# Patient Record
Sex: Female | Born: 1945 | Race: White | Hispanic: No | Marital: Married | Smoking: Former smoker
Health system: Southern US, Community
[De-identification: ages and names within clinical notes are randomized; demographics above are authoritative.]

## PROBLEM LIST (undated history)

## (undated) DIAGNOSIS — R112 Nausea with vomiting, unspecified: Secondary | ICD-10-CM

## (undated) DIAGNOSIS — Z9889 Other specified postprocedural states: Secondary | ICD-10-CM

## (undated) DIAGNOSIS — I10 Essential (primary) hypertension: Secondary | ICD-10-CM

## (undated) DIAGNOSIS — E7800 Pure hypercholesterolemia, unspecified: Secondary | ICD-10-CM

## (undated) DIAGNOSIS — D219 Benign neoplasm of connective and other soft tissue, unspecified: Secondary | ICD-10-CM

## (undated) HISTORY — PX: ABDOMINAL HYSTERECTOMY: SHX81

## (undated) HISTORY — DX: Essential (primary) hypertension: I10

## (undated) HISTORY — DX: Pure hypercholesterolemia, unspecified: E78.00

## (undated) HISTORY — DX: Benign neoplasm of connective and other soft tissue, unspecified: D21.9

---

## 2003-11-04 ENCOUNTER — Ambulatory Visit (HOSPITAL_COMMUNITY): Admission: RE | Admit: 2003-11-04 | Discharge: 2003-11-04 | Payer: Self-pay | Admitting: General Surgery

## 2004-02-24 ENCOUNTER — Ambulatory Visit (HOSPITAL_COMMUNITY): Admission: RE | Admit: 2004-02-24 | Discharge: 2004-02-24 | Payer: Self-pay | Admitting: Internal Medicine

## 2014-03-04 ENCOUNTER — Telehealth: Payer: Self-pay | Admitting: *Deleted

## 2014-03-04 NOTE — Telephone Encounter (Signed)
I called pt. According to out records, her last colonoscopy was 5/23/20015 by Dr. Laural Golden and the next was due in 10 years. She had received out letter from the recall list.  She said that she did have a colonoscopy in [place] in 2012 and it was normal and she was told her next one would be 10 years from that one. She did not know who did it.

## 2014-03-04 NOTE — Telephone Encounter (Signed)
Pt called stating she had a TCS in 2012 in [place]. Pt got a letter in the mail to schedule her colonoscopy. Please advise [phone] ask for 224 pt is at work.

## 2014-03-05 NOTE — Telephone Encounter (Signed)
Please check date of last TCS with Dr. Laural Golden, ?02/2004.  Can we request records of 2012 TCS from [facility]?

## 2014-03-06 NOTE — Telephone Encounter (Signed)
Called and informed pt. Sending to Manuela Schwartz to nic the next on in 03/2021.

## 2014-03-06 NOTE — Telephone Encounter (Signed)
Agree. Patient is not due for TCS until 03/2021 unless she has FH of colon cancer or colon polyps or she is having problems.   Please NIC for TCS 03/2021.

## 2014-03-06 NOTE — Telephone Encounter (Signed)
Pt's colonoscopy with Dr. Laural Golden was 02/24/2004. The one she had at [facility] was 03/25/2011 and the recommendation was for the next in 10 years.   Rita Marsh, these copies are on your cart!

## 2014-03-07 NOTE — Telephone Encounter (Signed)
I put pt on the recall list for next tcs 03/2021 and flagged it as a NUR patient. Is that correct?

## 2015-10-29 DIAGNOSIS — N189 Chronic kidney disease, unspecified: Secondary | ICD-10-CM | POA: Diagnosis not present

## 2015-10-29 DIAGNOSIS — Z0000 Encounter for general adult medical examination without abnormal findings: Secondary | ICD-10-CM | POA: Diagnosis not present

## 2015-12-22 DIAGNOSIS — Z1231 Encounter for screening mammogram for malignant neoplasm of breast: Secondary | ICD-10-CM | POA: Diagnosis not present

## 2016-01-29 DIAGNOSIS — M1711 Unilateral primary osteoarthritis, right knee: Secondary | ICD-10-CM | POA: Diagnosis not present

## 2016-01-29 DIAGNOSIS — M25461 Effusion, right knee: Secondary | ICD-10-CM | POA: Diagnosis not present

## 2016-02-02 DIAGNOSIS — L816 Other disorders of diminished melanin formation: Secondary | ICD-10-CM | POA: Diagnosis not present

## 2016-02-02 DIAGNOSIS — I872 Venous insufficiency (chronic) (peripheral): Secondary | ICD-10-CM | POA: Diagnosis not present

## 2016-02-05 DIAGNOSIS — H2513 Age-related nuclear cataract, bilateral: Secondary | ICD-10-CM | POA: Diagnosis not present

## 2016-02-05 DIAGNOSIS — H04123 Dry eye syndrome of bilateral lacrimal glands: Secondary | ICD-10-CM | POA: Diagnosis not present

## 2016-03-24 DIAGNOSIS — I10 Essential (primary) hypertension: Secondary | ICD-10-CM | POA: Diagnosis not present

## 2016-03-24 DIAGNOSIS — M1991 Primary osteoarthritis, unspecified site: Secondary | ICD-10-CM | POA: Diagnosis not present

## 2016-03-24 DIAGNOSIS — E782 Mixed hyperlipidemia: Secondary | ICD-10-CM | POA: Diagnosis not present

## 2016-03-24 DIAGNOSIS — K219 Gastro-esophageal reflux disease without esophagitis: Secondary | ICD-10-CM | POA: Diagnosis not present

## 2016-03-31 DIAGNOSIS — Z0001 Encounter for general adult medical examination with abnormal findings: Secondary | ICD-10-CM | POA: Diagnosis not present

## 2016-03-31 DIAGNOSIS — Z1389 Encounter for screening for other disorder: Secondary | ICD-10-CM | POA: Diagnosis not present

## 2016-03-31 DIAGNOSIS — N393 Stress incontinence (female) (male): Secondary | ICD-10-CM | POA: Diagnosis not present

## 2016-03-31 DIAGNOSIS — E782 Mixed hyperlipidemia: Secondary | ICD-10-CM | POA: Diagnosis not present

## 2016-03-31 DIAGNOSIS — I10 Essential (primary) hypertension: Secondary | ICD-10-CM | POA: Diagnosis not present

## 2016-03-31 DIAGNOSIS — M1711 Unilateral primary osteoarthritis, right knee: Secondary | ICD-10-CM | POA: Diagnosis not present

## 2016-03-31 DIAGNOSIS — K219 Gastro-esophageal reflux disease without esophagitis: Secondary | ICD-10-CM | POA: Diagnosis not present

## 2016-04-28 DIAGNOSIS — Z79899 Other long term (current) drug therapy: Secondary | ICD-10-CM | POA: Diagnosis not present

## 2016-04-28 DIAGNOSIS — Z87891 Personal history of nicotine dependence: Secondary | ICD-10-CM | POA: Diagnosis not present

## 2016-04-28 DIAGNOSIS — K219 Gastro-esophageal reflux disease without esophagitis: Secondary | ICD-10-CM | POA: Diagnosis not present

## 2016-04-28 DIAGNOSIS — Z90710 Acquired absence of both cervix and uterus: Secondary | ICD-10-CM | POA: Diagnosis not present

## 2016-04-28 DIAGNOSIS — Z13820 Encounter for screening for osteoporosis: Secondary | ICD-10-CM | POA: Diagnosis not present

## 2016-04-28 DIAGNOSIS — M8580 Other specified disorders of bone density and structure, unspecified site: Secondary | ICD-10-CM | POA: Diagnosis not present

## 2016-04-28 DIAGNOSIS — E7800 Pure hypercholesterolemia, unspecified: Secondary | ICD-10-CM | POA: Diagnosis not present

## 2016-04-28 DIAGNOSIS — M1990 Unspecified osteoarthritis, unspecified site: Secondary | ICD-10-CM | POA: Diagnosis not present

## 2016-04-28 DIAGNOSIS — Z780 Asymptomatic menopausal state: Secondary | ICD-10-CM | POA: Diagnosis not present

## 2016-07-06 DIAGNOSIS — H5319 Other subjective visual disturbances: Secondary | ICD-10-CM | POA: Diagnosis not present

## 2016-07-06 DIAGNOSIS — H04123 Dry eye syndrome of bilateral lacrimal glands: Secondary | ICD-10-CM | POA: Diagnosis not present

## 2016-09-02 DIAGNOSIS — L820 Inflamed seborrheic keratosis: Secondary | ICD-10-CM | POA: Diagnosis not present

## 2016-11-15 DIAGNOSIS — I10 Essential (primary) hypertension: Secondary | ICD-10-CM | POA: Diagnosis not present

## 2016-11-15 DIAGNOSIS — K219 Gastro-esophageal reflux disease without esophagitis: Secondary | ICD-10-CM | POA: Diagnosis not present

## 2016-11-15 DIAGNOSIS — E782 Mixed hyperlipidemia: Secondary | ICD-10-CM | POA: Diagnosis not present

## 2016-11-15 DIAGNOSIS — M1990 Unspecified osteoarthritis, unspecified site: Secondary | ICD-10-CM | POA: Diagnosis not present

## 2016-11-18 DIAGNOSIS — M7551 Bursitis of right shoulder: Secondary | ICD-10-CM | POA: Diagnosis not present

## 2016-11-18 DIAGNOSIS — I10 Essential (primary) hypertension: Secondary | ICD-10-CM | POA: Diagnosis not present

## 2016-11-18 DIAGNOSIS — M1711 Unilateral primary osteoarthritis, right knee: Secondary | ICD-10-CM | POA: Diagnosis not present

## 2016-11-18 DIAGNOSIS — K219 Gastro-esophageal reflux disease without esophagitis: Secondary | ICD-10-CM | POA: Diagnosis not present

## 2016-11-18 DIAGNOSIS — E782 Mixed hyperlipidemia: Secondary | ICD-10-CM | POA: Diagnosis not present

## 2016-11-18 DIAGNOSIS — Z6836 Body mass index (BMI) 36.0-36.9, adult: Secondary | ICD-10-CM | POA: Diagnosis not present

## 2016-12-23 DIAGNOSIS — Z1231 Encounter for screening mammogram for malignant neoplasm of breast: Secondary | ICD-10-CM | POA: Diagnosis not present

## 2017-01-18 ENCOUNTER — Ambulatory Visit: Payer: Self-pay | Admitting: Family Medicine

## 2017-06-22 DIAGNOSIS — K219 Gastro-esophageal reflux disease without esophagitis: Secondary | ICD-10-CM | POA: Diagnosis not present

## 2017-06-22 DIAGNOSIS — E782 Mixed hyperlipidemia: Secondary | ICD-10-CM | POA: Diagnosis not present

## 2017-06-22 DIAGNOSIS — I10 Essential (primary) hypertension: Secondary | ICD-10-CM | POA: Diagnosis not present

## 2017-06-27 DIAGNOSIS — E039 Hypothyroidism, unspecified: Secondary | ICD-10-CM | POA: Diagnosis not present

## 2017-06-27 DIAGNOSIS — I10 Essential (primary) hypertension: Secondary | ICD-10-CM | POA: Diagnosis not present

## 2017-06-27 DIAGNOSIS — M1711 Unilateral primary osteoarthritis, right knee: Secondary | ICD-10-CM | POA: Diagnosis not present

## 2017-06-27 DIAGNOSIS — K219 Gastro-esophageal reflux disease without esophagitis: Secondary | ICD-10-CM | POA: Diagnosis not present

## 2017-06-27 DIAGNOSIS — E782 Mixed hyperlipidemia: Secondary | ICD-10-CM | POA: Diagnosis not present

## 2017-06-27 DIAGNOSIS — Z6836 Body mass index (BMI) 36.0-36.9, adult: Secondary | ICD-10-CM | POA: Diagnosis not present

## 2017-06-27 DIAGNOSIS — N393 Stress incontinence (female) (male): Secondary | ICD-10-CM | POA: Diagnosis not present

## 2017-06-27 DIAGNOSIS — Z0001 Encounter for general adult medical examination with abnormal findings: Secondary | ICD-10-CM | POA: Diagnosis not present

## 2017-10-17 ENCOUNTER — Ambulatory Visit: Payer: PPO | Admitting: Adult Health

## 2017-10-17 ENCOUNTER — Encounter: Payer: Self-pay | Admitting: Adult Health

## 2017-10-17 ENCOUNTER — Encounter (INDEPENDENT_AMBULATORY_CARE_PROVIDER_SITE_OTHER): Payer: Self-pay

## 2017-10-17 VITALS — BP 130/70 | HR 69 | Ht 64.0 in | Wt 227.5 lb

## 2017-10-17 DIAGNOSIS — R300 Dysuria: Secondary | ICD-10-CM | POA: Diagnosis not present

## 2017-10-17 DIAGNOSIS — N812 Incomplete uterovaginal prolapse: Secondary | ICD-10-CM

## 2017-10-17 LAB — POCT URINALYSIS DIPSTICK
Blood, UA: NEGATIVE
GLUCOSE UA: NEGATIVE
KETONES UA: NEGATIVE
Leukocytes, UA: NEGATIVE
Nitrite, UA: NEGATIVE
Protein, UA: NEGATIVE

## 2017-10-17 NOTE — Progress Notes (Signed)
Subjective:     Patient ID: Rita Marsh, female   DOB: [birth year], 72 y.o.   MRN: [id number]  HPI Shenia is a 72 year old black female, married in complaining of "feels like bladder dropped" and burning with urination and hot flashes. She has ring pessary for 4 years but it is not working for last 6+months.She is sp hysterectomy for fibroids. 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 PCP is Dr Pleas Koch in [place].  Review of Systems +burning with urination Feels like bladder dropped +hot flashes  Reviewed past medical,surgical, social and family history. Reviewed medications and allergies.     Objective:   Physical Exam BP 130/70 (BP Location: Left Arm, Patient Position: Sitting, Cuff Size: Large)   Pulse 69   Ht 5\' 4"  (1.626 m)   Wt 227 lb 8 oz (103.2 kg)   BMI 39.05 kg/m Urine dipstick negative. PHQ 2 score 0. Skin warm and dry.Pelvic: external genitalia is normal in appearance no lesions, vagina: pale with loss of moisture and rugae, tan discharge, no odor, with +cycstocele, old #3 ring pessary removed,urethra has no lesions or masses noted, cervix and uterus absent, adnexa: no masses or tenderness noted. Bladder is non tender and no masses felt.Dr Elonda Husky in and pt fitted with #6 ring pessary with support. Discussed SSRI for hot flashes and she declines for now.     Assessment:     1. Cystocele with incomplete uterovaginal prolapse   2. Burning with urination       Plan:    Order Milex #6 ring with support  Return in 2 weeks for pessary insertion

## 2017-10-31 ENCOUNTER — Ambulatory Visit: Payer: PPO | Admitting: Adult Health

## 2017-10-31 ENCOUNTER — Other Ambulatory Visit: Payer: Self-pay

## 2017-10-31 ENCOUNTER — Encounter: Payer: Self-pay | Admitting: Adult Health

## 2017-10-31 VITALS — BP 130/72 | HR 85 | Ht 64.0 in | Wt 225.0 lb

## 2017-10-31 DIAGNOSIS — N812 Incomplete uterovaginal prolapse: Secondary | ICD-10-CM | POA: Diagnosis not present

## 2017-10-31 NOTE — Progress Notes (Signed)
Subjective:     Patient ID: Rita Marsh, female   DOB: 06-24-1946, 72 y.o.   MRN: [id number]  HPI  Rita Marsh is a 72 year old black female in for pessary insertion, she is coming from exercise this morning she says.  Review of Systems For pessary insertion Reviewed past medical,surgical, social and family history. Reviewed medications and allergies.     Objective:   Physical Exam BP 130/72 (BP Location: Right Arm, Patient Position: Sitting, Cuff Size: Large)   Pulse 85   Ht 5\' 4"  (1.626 m)   Wt 225 lb (102.1 kg)   BMI 38.62 kg/m Skin warm and dry, pt has cystocele protruding vagina, # 6 ring with support easily inserted and pt,walked around in room, no discomfort.      Assessment:     1. Cystocele with incomplete uterovaginal prolapse   pessary inserted      Plan:    F/U in 2 weeks on pessary

## 2017-11-14 ENCOUNTER — Ambulatory Visit: Payer: PPO | Admitting: Adult Health

## 2017-11-17 ENCOUNTER — Encounter: Payer: Self-pay | Admitting: Adult Health

## 2017-11-17 ENCOUNTER — Ambulatory Visit: Payer: PPO | Admitting: Adult Health

## 2017-11-17 VITALS — BP 124/60 | HR 68 | Ht 64.0 in | Wt 219.0 lb

## 2017-11-17 DIAGNOSIS — N812 Incomplete uterovaginal prolapse: Secondary | ICD-10-CM

## 2017-11-17 DIAGNOSIS — Z4689 Encounter for fitting and adjustment of other specified devices: Secondary | ICD-10-CM | POA: Diagnosis not present

## 2017-11-17 NOTE — Progress Notes (Signed)
Subjective:     Patient ID: Rita Marsh, female   DOB: June 16, 1946, 72 y.o.   MRN: [id number]  HPI Rita Marsh is a 72 year old black female in for recheck on pessary, and it is good, but had discharge with some pink first few days after having it inserted, better now.   Review of Systems +discahrge when first got new pessary, better now Reviewed past medical,surgical, social and family history. Reviewed medications and allergies.     Objective:   Physical Exam BP 124/60 (BP Location: Left Arm, Patient Position: Sitting, Cuff Size: Normal)   Pulse 68   Ht 5\' 4"  (1.626 m)   Wt 219 lb (99.3 kg)   BMI 37.59 kg/m    Pessary removed and cleaned with warm water and soap, she has some tan discharge, with no odor and or irritation on speculum exam noted, +cystocele and prolapse,Pessay easily reinserted.She will try to remove and clean herself, but if she I can't I will.   Assessment:     1. Cystocele with incomplete uterovaginal prolapse   2. Pessary maintenance       Plan:     F/U in 3 months of needs pessary cleaned and reinserted

## 2017-12-26 DIAGNOSIS — Z1231 Encounter for screening mammogram for malignant neoplasm of breast: Secondary | ICD-10-CM | POA: Diagnosis not present

## 2018-02-14 ENCOUNTER — Ambulatory Visit: Payer: PPO | Admitting: Adult Health

## 2018-05-08 DIAGNOSIS — L820 Inflamed seborrheic keratosis: Secondary | ICD-10-CM | POA: Diagnosis not present

## 2018-07-21 DIAGNOSIS — I10 Essential (primary) hypertension: Secondary | ICD-10-CM | POA: Diagnosis not present

## 2018-07-21 DIAGNOSIS — E782 Mixed hyperlipidemia: Secondary | ICD-10-CM | POA: Diagnosis not present

## 2018-07-21 DIAGNOSIS — H2513 Age-related nuclear cataract, bilateral: Secondary | ICD-10-CM | POA: Diagnosis not present

## 2018-07-21 DIAGNOSIS — H40033 Anatomical narrow angle, bilateral: Secondary | ICD-10-CM | POA: Diagnosis not present

## 2018-07-21 DIAGNOSIS — E039 Hypothyroidism, unspecified: Secondary | ICD-10-CM | POA: Diagnosis not present

## 2018-07-21 DIAGNOSIS — K219 Gastro-esophageal reflux disease without esophagitis: Secondary | ICD-10-CM | POA: Diagnosis not present

## 2018-07-26 DIAGNOSIS — Z6836 Body mass index (BMI) 36.0-36.9, adult: Secondary | ICD-10-CM | POA: Diagnosis not present

## 2018-07-26 DIAGNOSIS — K219 Gastro-esophageal reflux disease without esophagitis: Secondary | ICD-10-CM | POA: Diagnosis not present

## 2018-07-26 DIAGNOSIS — M1711 Unilateral primary osteoarthritis, right knee: Secondary | ICD-10-CM | POA: Diagnosis not present

## 2018-07-26 DIAGNOSIS — Z23 Encounter for immunization: Secondary | ICD-10-CM | POA: Diagnosis not present

## 2018-07-26 DIAGNOSIS — Z0001 Encounter for general adult medical examination with abnormal findings: Secondary | ICD-10-CM | POA: Diagnosis not present

## 2018-07-26 DIAGNOSIS — E782 Mixed hyperlipidemia: Secondary | ICD-10-CM | POA: Diagnosis not present

## 2018-07-26 DIAGNOSIS — I10 Essential (primary) hypertension: Secondary | ICD-10-CM | POA: Diagnosis not present

## 2018-07-26 DIAGNOSIS — E039 Hypothyroidism, unspecified: Secondary | ICD-10-CM | POA: Diagnosis not present

## 2018-08-25 DIAGNOSIS — H2511 Age-related nuclear cataract, right eye: Secondary | ICD-10-CM | POA: Diagnosis not present

## 2018-08-25 DIAGNOSIS — H25812 Combined forms of age-related cataract, left eye: Secondary | ICD-10-CM | POA: Diagnosis not present

## 2018-08-25 DIAGNOSIS — H25811 Combined forms of age-related cataract, right eye: Secondary | ICD-10-CM | POA: Diagnosis not present

## 2018-09-01 DIAGNOSIS — H04123 Dry eye syndrome of bilateral lacrimal glands: Secondary | ICD-10-CM | POA: Diagnosis not present

## 2018-11-03 DIAGNOSIS — H2512 Age-related nuclear cataract, left eye: Secondary | ICD-10-CM | POA: Diagnosis not present

## 2018-11-03 DIAGNOSIS — H25812 Combined forms of age-related cataract, left eye: Secondary | ICD-10-CM | POA: Diagnosis not present

## 2018-11-10 DIAGNOSIS — H04123 Dry eye syndrome of bilateral lacrimal glands: Secondary | ICD-10-CM | POA: Diagnosis not present

## 2019-01-29 DIAGNOSIS — R300 Dysuria: Secondary | ICD-10-CM | POA: Diagnosis not present

## 2019-01-29 DIAGNOSIS — Z6835 Body mass index (BMI) 35.0-35.9, adult: Secondary | ICD-10-CM | POA: Diagnosis not present

## 2019-02-12 ENCOUNTER — Telehealth: Payer: Self-pay | Admitting: Obstetrics & Gynecology

## 2019-02-12 NOTE — Telephone Encounter (Signed)
Patient called stating that the last time she came in they placed a pessary and she would like to schedule and appointment to check her pessary. Please contact pt

## 2019-02-15 ENCOUNTER — Telehealth: Payer: Self-pay | Admitting: *Deleted

## 2019-02-15 NOTE — Telephone Encounter (Signed)
Patient informed that we are not allowing visitors or children to come to appointments at this time. Patient advised to wear a mask to her appointment if she has one. Patient denies any contact with anyone suspected or confirmed of having COVID-19. Pt denies fever, cough, sob, muscle pain, diarrhea, rash, vomiting, abdominal pain, red eye, weakness, bruising or bleeding, joint pain or severe headache.    

## 2019-02-16 ENCOUNTER — Encounter: Payer: Self-pay | Admitting: Adult Health

## 2019-02-16 ENCOUNTER — Other Ambulatory Visit: Payer: Self-pay

## 2019-02-16 ENCOUNTER — Ambulatory Visit: Payer: PPO | Admitting: Adult Health

## 2019-02-16 VITALS — BP 136/75 | HR 80 | Temp 98.1°F | Ht 64.0 in | Wt 210.5 lb

## 2019-02-16 DIAGNOSIS — Z4689 Encounter for fitting and adjustment of other specified devices: Secondary | ICD-10-CM | POA: Diagnosis not present

## 2019-02-16 DIAGNOSIS — N812 Incomplete uterovaginal prolapse: Secondary | ICD-10-CM | POA: Diagnosis not present

## 2019-02-16 NOTE — Progress Notes (Signed)
Patient ID: Rita Marsh, female   DOB: Feb 22, 1946, 73 y.o.   MRN: [id number] History of Present Illness: Rita Marsh is a 73 year old black female, married, sp hysterectomy with cystocele, that was first seen in January 2019 and fitted with # 6 pessary by Dr Elonda Husky and it was inserted 11/01/27. She has noticed lately pain in lower stomach and felt it was related to her pessary so she took it out and put a # 3 ring she had in and pain went away but it is not enough support.  PCP is Dr Pleas Koch in [place].   Current Medications, Allergies, Past Medical History, Past Surgical History, Family History and Social History were reviewed in [facility] record.     Review of Systems: Stomach hurt with #6 pessary and had some blood when removed She is wearing old #3 ring but needs bigger pessary for support, she is active, exercised 3 X per week     Physical Exam:BP 136/75 (BP Location: Right Arm, Patient Position: Sitting, Cuff Size: Large)   Pulse 80   Temp 98.1 F (36.7 C)   Ht 5\' 4"  (1.626 m)   Wt 210 lb 8 oz (95.5 kg)   BMI 36.13 kg/m  General:  Well developed, well nourished, no acute distress Skin:  Warm and dry Pelvic:  External genitalia is normal in appearance, no lesions.  The vagina is pink with cystocele, #3 ring removed.No lesions seen, but did have some blood,felt to be from removing and reinserting the pessaries. Urethra has no lesions or masses. The cervix and uterus are absent.  No adnexal masses or tenderness noted.Bladder is non tender, no masses felt. Tried #4 ring with support and it felt good to her, and then removed it and tried #5 and she says it felt a little big, she wants the #4, so will order #4 ring with support. I washed her old # 3 pessary and reinserted it for her.  Psych:  No mood changes, alert and cooperative,seems happy Examination chaperoned by Levy Pupa LPN.    Impression and Plan:  1. Encounter for fitting and adjustment of  pessary -will order #4 ring pessary with support -Will call her when it comes in for placement   2. Cystocele with incomplete uterovaginal prolapse

## 2019-02-20 DIAGNOSIS — Z1231 Encounter for screening mammogram for malignant neoplasm of breast: Secondary | ICD-10-CM | POA: Diagnosis not present

## 2019-03-02 ENCOUNTER — Telehealth: Payer: Self-pay | Admitting: *Deleted

## 2019-03-02 NOTE — Telephone Encounter (Signed)
Patient informed we are still not allowing any visitors or children to come in during appointment time unless physical assistance is needed. Asked if has had any exposure to anyone suspected or confirmed of having COVID-19 or if she was experiencing any of the following, to reschedule: fever, cough, shortness of breath, muscle pain, diarrhea, rash, vomiting, abdominal pain, red eye, weakness, bruising, bleeding, joint pain, or a severe headache.  Stated no to all.  Asked that she complete E-check-in via mychart prior to arrival.  Advised to check-in via [facility] and call our office on arrival in our office parking lot to complete registration over the phone. Advised to also use the provided hand sanitizer when entering the office and to wear a mask if they have one of their own, if not, we are happy to provide one. Pt verbalized understanding.      

## 2019-03-05 ENCOUNTER — Other Ambulatory Visit: Payer: Self-pay

## 2019-03-05 ENCOUNTER — Ambulatory Visit (INDEPENDENT_AMBULATORY_CARE_PROVIDER_SITE_OTHER): Payer: PPO | Admitting: Adult Health

## 2019-03-05 ENCOUNTER — Encounter: Payer: Self-pay | Admitting: Adult Health

## 2019-03-05 VITALS — BP 130/62 | HR 64 | Ht 64.0 in | Wt 213.5 lb

## 2019-03-05 DIAGNOSIS — N812 Incomplete uterovaginal prolapse: Secondary | ICD-10-CM | POA: Diagnosis not present

## 2019-03-05 DIAGNOSIS — Z4689 Encounter for fitting and adjustment of other specified devices: Secondary | ICD-10-CM | POA: Diagnosis not present

## 2019-03-05 NOTE — Progress Notes (Addendum)
  Subjective:     Patient ID: Rita Marsh, female   DOB: 1946/06/21, 73 y.o.   MRN: [id number]  HPI Rita Marsh is a 73 year old black female, sp hysterectomy with cystocele, in for insertion of new #4 ring with support pessary. PCP is Dr Pleas Koch.  Review of Systems Has had pressure with #6 pessary so she removed and using # 3 ring but was having some prolapse.  Reviewed past medical,surgical, social and family history. Reviewed medications and allergies.     Objective:   Physical Exam BP 130/62 (BP Location: Left Arm, Patient Position: Sitting, Cuff Size: Normal)   Pulse 64   Ht 5\' 4"  (1.626 m)   Wt 213 lb 8 oz (96.8 kg)   BMI 36.65 kg/m   Skin warm and dry.Pelvic: external genitalia is normal in appearance no lesions, vagina: +cystocele, old pessary removed and cleaned, no irritation noted, has creamy discharge, no odor, then #4 ring with support inserted and ahe says it feel good, used mirror to let her see, urethra has no lesions or masses noted, cervix and uterus absent. She removes and cleans pessary herself. Old pessary given back to her in baggie.  Examination chaperoned by Estill Bamberg Rash LPN.    Assessment:     1. Pessary maintenance   2. Cystocele with incomplete uterovaginal prolapse       Plan:     # 4 ring pessary with support inserted Follow up prn

## 2019-03-05 NOTE — Patient Instructions (Signed)
Follow up prn

## 2019-03-06 DIAGNOSIS — H43393 Other vitreous opacities, bilateral: Secondary | ICD-10-CM | POA: Diagnosis not present

## 2019-03-08 DIAGNOSIS — H43812 Vitreous degeneration, left eye: Secondary | ICD-10-CM | POA: Diagnosis not present

## 2019-04-02 DIAGNOSIS — H43812 Vitreous degeneration, left eye: Secondary | ICD-10-CM | POA: Diagnosis not present

## 2019-07-30 DIAGNOSIS — E782 Mixed hyperlipidemia: Secondary | ICD-10-CM | POA: Diagnosis not present

## 2019-07-30 DIAGNOSIS — E039 Hypothyroidism, unspecified: Secondary | ICD-10-CM | POA: Diagnosis not present

## 2019-07-30 DIAGNOSIS — I10 Essential (primary) hypertension: Secondary | ICD-10-CM | POA: Diagnosis not present

## 2019-07-30 DIAGNOSIS — K219 Gastro-esophageal reflux disease without esophagitis: Secondary | ICD-10-CM | POA: Diagnosis not present

## 2019-08-08 DIAGNOSIS — Z23 Encounter for immunization: Secondary | ICD-10-CM | POA: Diagnosis not present

## 2019-08-08 DIAGNOSIS — E782 Mixed hyperlipidemia: Secondary | ICD-10-CM | POA: Diagnosis not present

## 2019-08-08 DIAGNOSIS — K219 Gastro-esophageal reflux disease without esophagitis: Secondary | ICD-10-CM | POA: Diagnosis not present

## 2019-08-08 DIAGNOSIS — M7551 Bursitis of right shoulder: Secondary | ICD-10-CM | POA: Diagnosis not present

## 2019-08-08 DIAGNOSIS — E039 Hypothyroidism, unspecified: Secondary | ICD-10-CM | POA: Diagnosis not present

## 2019-08-08 DIAGNOSIS — I10 Essential (primary) hypertension: Secondary | ICD-10-CM | POA: Diagnosis not present

## 2019-08-08 DIAGNOSIS — Z6836 Body mass index (BMI) 36.0-36.9, adult: Secondary | ICD-10-CM | POA: Diagnosis not present

## 2019-08-08 DIAGNOSIS — Z0001 Encounter for general adult medical examination with abnormal findings: Secondary | ICD-10-CM | POA: Diagnosis not present

## 2019-11-27 DIAGNOSIS — E782 Mixed hyperlipidemia: Secondary | ICD-10-CM | POA: Diagnosis not present

## 2019-11-27 DIAGNOSIS — R946 Abnormal results of thyroid function studies: Secondary | ICD-10-CM | POA: Diagnosis not present

## 2019-12-13 DIAGNOSIS — I872 Venous insufficiency (chronic) (peripheral): Secondary | ICD-10-CM | POA: Diagnosis not present

## 2019-12-13 DIAGNOSIS — D2372 Other benign neoplasm of skin of left lower limb, including hip: Secondary | ICD-10-CM | POA: Diagnosis not present

## 2019-12-26 ENCOUNTER — Encounter (HOSPITAL_COMMUNITY): Payer: Self-pay | Admitting: Emergency Medicine

## 2019-12-26 ENCOUNTER — Other Ambulatory Visit: Payer: Self-pay

## 2019-12-26 ENCOUNTER — Observation Stay (HOSPITAL_COMMUNITY)
Admission: EM | Admit: 2019-12-26 | Discharge: 2019-12-27 | Disposition: A | Discharge status: Home / Self Care | Payer: PPO | Attending: Family Medicine | Admitting: Family Medicine

## 2019-12-26 ENCOUNTER — Emergency Department (HOSPITAL_COMMUNITY): Payer: PPO

## 2019-12-26 DIAGNOSIS — G459 Transient cerebral ischemic attack, unspecified: Secondary | ICD-10-CM | POA: Diagnosis not present

## 2019-12-26 DIAGNOSIS — I10 Essential (primary) hypertension: Secondary | ICD-10-CM

## 2019-12-26 DIAGNOSIS — Z79899 Other long term (current) drug therapy: Secondary | ICD-10-CM | POA: Diagnosis not present

## 2019-12-26 DIAGNOSIS — Z20822 Contact with and (suspected) exposure to covid-19: Secondary | ICD-10-CM | POA: Diagnosis not present

## 2019-12-26 DIAGNOSIS — E785 Hyperlipidemia, unspecified: Secondary | ICD-10-CM | POA: Diagnosis not present

## 2019-12-26 NOTE — ED Provider Notes (Signed)
[facility] EMERGENCY DEPARTMENT Provider Note   CSN: [id number] Arrival date & time: 12/26/19  2123   History Chief Complaint  Patient presents with  . Hypertension    Rita Marsh is a 74 y.o. female.  The history is provided by the patient.  Hypertension  She has history of hypertension and hyperlipidemia and comes in because of blood pressure being elevated at home.  She noted that the right side of her face got numb and she was worried that it was related to her blood pressure.  She checked her blood pressure and it was 189/94.  She checked the 2 other times and it was in the same range.  She denies headache or dizziness.  She denies any visual change.  She denies any weakness or incoordination.  She has been ambulatory without difficulty.  There was no numbness in her arms or legs.  Numbness subsided after about 1 hour and she feels like she is back to her baseline.  She does not usually check her blood pressure at home.  She is currently not taking any aspirin or any other antiplatelet agents.  Past Medical History:  Diagnosis Date  . Fibroids   . High cholesterol   . Hypertension     Patient Active Problem List   Diagnosis Date Noted  . Pessary maintenance 03/05/2019  . Cystocele with incomplete uterovaginal prolapse 03/05/2019    Past Surgical History:  Procedure Laterality Date  . ABDOMINAL HYSTERECTOMY       OB History    Gravida  3   Para  2   Term  2   Preterm      AB  1   Living  2     SAB  1   TAB      Ectopic      Multiple      Live Births  2           Family History  Problem Relation Age of Onset  . Other Paternal Grandmother        hit by car  . Heart attack Father   . High Cholesterol Mother   . Hypertension Mother   . Hypertension Brother   . High Cholesterol Brother     Social History   Tobacco Use  . Smoking status: Former Smoker    Years: 9.00    Types: Cigarettes  . Smokeless tobacco: Never Used    Substance Use Topics  . Alcohol use: No  . Drug use: No    Home Medications Prior to Admission medications   Medication Sig Start Date End Date Taking? Authorizing Provider  CALCIUM PO Take by mouth daily.    [provider]  fluticasone (FLONASE) 50 MCG/ACT nasal spray Place 2 sprays into both nostrils as needed.  08/29/17   [provider]  Loratadine (CLARITIN PO) Take by mouth as needed.    [provider]  losartan (COZAAR) 25 MG tablet Take 25 mg by mouth daily.  09/26/17   [provider]  Multiple Vitamin (MULTIVITAMIN) tablet Take 1 tablet by mouth daily.    [provider]  Omega-3 Fatty Acids (FISH OIL PO) Take by mouth daily.    [provider]  rosuvastatin (CRESTOR) 20 MG tablet Take 10 mg by mouth daily.     [provider]    Allergies    Patient has no known allergies.  Review of Systems   Review of Systems  All other systems reviewed  and are negative.   Physical Exam Updated Vital Signs BP (!) 152/79   Pulse (!) 106   Temp 98.5 F (36.9 C) (Oral)   Resp 16   Ht 5\' 5"  (1.651 m)   Wt 98.9 kg   SpO2 100%   BMI 36.28 kg/m   Physical Exam Vitals and nursing note reviewed.   74 year old female, resting comfortably and in no acute distress. Vital signs are significant for mildly elevated blood pressure and heart rate. Oxygen saturation is 100%, which is normal. Head is normocephalic and atraumatic. PERRLA, EOMI. Oropharynx is clear. Neck is nontender and supple without adenopathy or JVD.  There are no carotid bruits. Back is nontender and there is no CVA tenderness. Lungs are clear without rales, wheezes, or rhonchi. Chest is nontender. Heart has regular rate and rhythm without murmur. Abdomen is soft, flat, nontender without masses or hepatosplenomegaly and peristalsis is normoactive. Extremities have no cyanosis or edema, full range of motion is present. Skin is warm and dry without  rash. Neurologic: Mental status is normal, cranial nerves are intact, there are no motor or sensory deficits.  There is no pronator drift.  There is no extinction on double simultaneous stimulation.  ED Results / Procedures / Treatments   Labs (all labs ordered are listed, but only abnormal results are displayed) Labs Reviewed  COMPREHENSIVE METABOLIC PANEL - Abnormal; Notable for the following components:      Result Value   Glucose, Bld 106 (*)    Total Protein 8.5 (*)    All other components within normal limits  URINALYSIS, ROUTINE W REFLEX MICROSCOPIC - Abnormal; Notable for the following components:   Color, Urine STRAW (*)    Hgb urine dipstick SMALL (*)    Leukocytes,Ua SMALL (*)    All other components within normal limits  SARS CORONAVIRUS 2 (TAT 6-24 HRS)  ETHANOL  PROTIME-INR  APTT  CBC  DIFFERENTIAL  RAPID URINE DRUG SCREEN, HOSP PERFORMED    EKG EKG Interpretation  Date/Time:  Thursday December 27 2019 00:03:53 EDT Ventricular Rate:  86 PR Interval:    QRS Duration: 80 QT Interval:  380 QTC Calculation: 455 R Axis:   43 Text Interpretation: Sinus rhythm Probable left atrial enlargement Otherwise within normal limits No old tracing to compare Confirmed by Delora Fuel ([id number]) on 12/27/2019 12:08:33 AM   Radiology CT HEAD WO CONTRAST  Result Date: 12/27/2019 CLINICAL DATA:  Hypertension, headache, TIA EXAM: CT HEAD WITHOUT CONTRAST TECHNIQUE: Contiguous axial images were obtained from the base of the skull through the vertex without intravenous contrast. COMPARISON:  None. FINDINGS: Brain: No acute intracranial abnormality. Specifically, no hemorrhage, hydrocephalus, mass lesion, acute infarction, or significant intracranial injury. Vascular: No hyperdense vessel or unexpected calcification. Skull: No acute calvarial abnormality. Sinuses/Orbits: Visualized paranasal sinuses and mastoids clear. Orbital soft tissues unremarkable. Other: None IMPRESSION: No acute  intracranial abnormality. Electronically Signed   By: Rolm Baptise M.D.   On: 12/27/2019 00:47    Procedures Procedures   Medications Ordered in ED Medications - No data to display  ED Course  I have reviewed the triage vital signs and the nursing notes.  Pertinent labs & imaging results that were available during my care of the patient were reviewed by me and considered in my medical decision making (see chart for details).  MDM Rules/Calculators/A&P Transient facial numbness.  Elevated blood pressure.  Transient numbness is most consistent with TIA.  No neurologic deficits currently.  Will check screening labs and  CT of head.  Will need to be admitted for evaluation of TIA.  Old records are reviewed, and she has no relevant past visits.  ECG is unremarkable.  CT of head is unremarkable, labs are unremarkable.  Case is discussed with Dr. Scherrie November of [facility] hospital[facility], who agrees to admit the patient.  Final Clinical Impression(s) / ED Diagnoses Final diagnoses:  TIA (transient ischemic attack)  Elevated blood pressure reading with diagnosis of hypertension    Rx / DC Orders ED Discharge Orders    None       Delora Fuel, MD [id number]

## 2019-12-26 NOTE — ED Triage Notes (Signed)
Patient complains of hypertension that began this afternoon. Patient is on Losartan 25mg  and last took it this morning.

## 2019-12-27 ENCOUNTER — Observation Stay (HOSPITAL_COMMUNITY): Payer: PPO

## 2019-12-27 ENCOUNTER — Encounter (HOSPITAL_COMMUNITY): Payer: Self-pay | Admitting: Internal Medicine

## 2019-12-27 ENCOUNTER — Observation Stay (HOSPITAL_BASED_OUTPATIENT_CLINIC_OR_DEPARTMENT_OTHER): Payer: PPO

## 2019-12-27 DIAGNOSIS — R200 Anesthesia of skin: Secondary | ICD-10-CM | POA: Diagnosis not present

## 2019-12-27 DIAGNOSIS — E785 Hyperlipidemia, unspecified: Secondary | ICD-10-CM

## 2019-12-27 DIAGNOSIS — G459 Transient cerebral ischemic attack, unspecified: Secondary | ICD-10-CM

## 2019-12-27 DIAGNOSIS — I6523 Occlusion and stenosis of bilateral carotid arteries: Secondary | ICD-10-CM | POA: Diagnosis not present

## 2019-12-27 DIAGNOSIS — I10 Essential (primary) hypertension: Secondary | ICD-10-CM

## 2019-12-27 LAB — DIFFERENTIAL
Abs Immature Granulocytes: 0 10*3/uL (ref 0.00–0.07)
Basophils Absolute: 0 10*3/uL (ref 0.0–0.1)
Basophils Relative: 0 %
Eosinophils Absolute: 0.1 10*3/uL (ref 0.0–0.5)
Eosinophils Relative: 1 %
Immature Granulocytes: 0 %
Lymphocytes Relative: 31 %
Lymphs Abs: 2.1 10*3/uL (ref 0.7–4.0)
Monocytes Absolute: 0.6 10*3/uL (ref 0.1–1.0)
Monocytes Relative: 9 %
Neutro Abs: 4 10*3/uL (ref 1.7–7.7)
Neutrophils Relative %: 59 %

## 2019-12-27 LAB — VITAMIN B12: Vitamin B-12: 522 pg/mL (ref 180–914)

## 2019-12-27 LAB — COMPREHENSIVE METABOLIC PANEL
ALT: 17 U/L (ref 0–44)
AST: 25 U/L (ref 15–41)
Albumin: 4.4 g/dL (ref 3.5–5.0)
Alkaline Phosphatase: 59 U/L (ref 38–126)
Anion gap: 8 (ref 5–15)
BUN: 21 mg/dL (ref 8–23)
CO2: 26 mmol/L (ref 22–32)
Calcium: 9.2 mg/dL (ref 8.9–10.3)
Chloride: 103 mmol/L (ref 98–111)
Creatinine, Ser: 0.95 mg/dL (ref 0.44–1.00)
GFR calc Af Amer: >60 mL/min (ref >60)
GFR calc non Af Amer: >60 mL/min (ref >60)
Glucose, Bld: 106 mg/dL — ABNORMAL HIGH (ref 70–99)
Potassium: 3.9 mmol/L (ref 3.5–5.1)
Sodium: 137 mmol/L (ref 135–145)
Total Bilirubin: 0.6 mg/dL (ref 0.3–1.2)
Total Protein: 8.5 g/dL — ABNORMAL HIGH (ref 6.5–8.1)

## 2019-12-27 LAB — CBC
HCT: 41.6 % (ref 36.0–46.0)
Hemoglobin: 13.3 g/dL (ref 12.0–15.0)
MCH: 29.4 pg (ref 26.0–34.0)
MCHC: 32 g/dL (ref 30.0–36.0)
MCV: 91.8 fL (ref 80.0–100.0)
Platelets: 300 10*3/uL (ref 150–400)
RBC: 4.53 MIL/uL (ref 3.87–5.11)
RDW: 12.7 % (ref 11.5–15.5)
WBC: 6.8 10*3/uL (ref 4.0–10.5)
nRBC: 0 % (ref 0.0–0.2)

## 2019-12-27 LAB — URINALYSIS, ROUTINE W REFLEX MICROSCOPIC
Bacteria, UA: NONE SEEN
Bilirubin Urine: NEGATIVE
Glucose, UA: NEGATIVE mg/dL
Ketones, ur: NEGATIVE mg/dL
Nitrite: NEGATIVE
Protein, ur: NEGATIVE mg/dL
Specific Gravity, Urine: 1.006 (ref 1.005–1.030)
pH: 7 (ref 5.0–8.0)

## 2019-12-27 LAB — LIPID PANEL
Cholesterol: 157 mg/dL (ref 0–200)
HDL: 55 mg/dL (ref >40)
LDL Cholesterol: 88 mg/dL (ref 0–99)
Total CHOL/HDL Ratio: 2.9 RATIO
Triglycerides: 69 mg/dL (ref <150)
VLDL: 14 mg/dL (ref 0–40)

## 2019-12-27 LAB — ECHOCARDIOGRAM COMPLETE
Height: 65 in
Weight: 3488 oz

## 2019-12-27 LAB — RAPID URINE DRUG SCREEN, HOSP PERFORMED
Amphetamines: NOT DETECTED
Barbiturates: NOT DETECTED
Benzodiazepines: NOT DETECTED
Cocaine: NOT DETECTED
Opiates: NOT DETECTED
Tetrahydrocannabinol: NOT DETECTED

## 2019-12-27 LAB — PROTIME-INR
INR: 1 (ref 0.8–1.2)
Prothrombin Time: 13.5 seconds (ref 11.4–15.2)

## 2019-12-27 LAB — APTT: aPTT: 28 seconds (ref 24–36)

## 2019-12-27 LAB — HEMOGLOBIN A1C
Hgb A1c MFr Bld: 6.1 % — ABNORMAL HIGH (ref 4.8–5.6)
Mean Plasma Glucose: 128.37 mg/dL

## 2019-12-27 LAB — SARS CORONAVIRUS 2 (TAT 6-24 HRS): SARS Coronavirus 2: NEGATIVE

## 2019-12-27 LAB — ETHANOL: Alcohol, Ethyl (B): <10 mg/dL (ref <10)

## 2019-12-27 LAB — TSH: TSH: 3.55 u[IU]/mL (ref 0.350–4.500)

## 2019-12-27 MED ORDER — SENNOSIDES-DOCUSATE SODIUM 8.6-50 MG PO TABS
1.0000 | ORAL_TABLET | Freq: Every evening | ORAL | Status: DC | PRN
  Filled 2019-12-27: qty 1

## 2019-12-27 MED ORDER — ACETAMINOPHEN 650 MG RE SUPP: 650.0000 mg | RECTAL | Status: DC | PRN

## 2019-12-27 MED ORDER — ACETAMINOPHEN 325 MG PO TABS: 650.0000 mg | ORAL_TABLET | ORAL | Status: DC | PRN

## 2019-12-27 MED ORDER — ADULT MULTIVITAMIN W/MINERALS CH
1.0000 | ORAL_TABLET | Freq: Every day | ORAL | Status: DC
  Administered 2019-12-27: 09:00:00 1 via ORAL
  Filled 2019-12-27: qty 1

## 2019-12-27 MED ORDER — OMEGA-3-ACID ETHYL ESTERS 1 G PO CAPS
1.0000 g | ORAL_CAPSULE | Freq: Every day | ORAL | Status: DC
  Administered 2019-12-27: 1 g via ORAL
  Filled 2019-12-27 (×3): qty 1

## 2019-12-27 MED ORDER — ASPIRIN 81 MG PO TBEC: 81.0000 mg | DELAYED_RELEASE_TABLET | Freq: Every day | ORAL | 2 refills | Status: AC

## 2019-12-27 MED ORDER — ACETAMINOPHEN 160 MG/5ML PO SOLN: 650.0000 mg | ORAL | Status: DC | PRN

## 2019-12-27 MED ORDER — STROKE: EARLY STAGES OF RECOVERY BOOK
Freq: Once | Status: DC
  Filled 2019-12-27: qty 1

## 2019-12-27 MED ORDER — ASPIRIN EC 81 MG PO TBEC
81.0000 mg | DELAYED_RELEASE_TABLET | Freq: Every day | ORAL | Status: DC
  Administered 2019-12-27: 81 mg via ORAL
  Filled 2019-12-27: qty 1

## 2019-12-27 MED ORDER — LOSARTAN POTASSIUM 25 MG PO TABS: 25.0000 mg | ORAL_TABLET | Freq: Every day | ORAL | 3 refills | Status: DC

## 2019-12-27 MED ORDER — ENOXAPARIN SODIUM 40 MG/0.4ML ~~LOC~~ SOLN
40.0000 mg | SUBCUTANEOUS | Status: DC
  Filled 2019-12-27: qty 0.4

## 2019-12-27 MED ORDER — ROSUVASTATIN CALCIUM 10 MG PO TABS
10.0000 mg | ORAL_TABLET | Freq: Every day | ORAL | Status: DC
  Filled 2019-12-27 (×2): qty 1

## 2019-12-27 MED ORDER — ROSUVASTATIN CALCIUM 10 MG PO TABS: 15.0000 mg | ORAL_TABLET | Freq: Every day | ORAL | 3 refills | Status: AC

## 2019-12-27 NOTE — Progress Notes (Signed)
*  PRELIMINARY RESULTS* Echocardiogram 2D Echocardiogram has been performed.  Rita Marsh 12/27/2019, 12:42 PM

## 2019-12-27 NOTE — Discharge Summary (Signed)
Rita Marsh, is a 74 y.o. female  DOB Aug 06, 1946  MRN [id number].  Admission date:  12/26/2019  Admitting Physician  Lynetta Mare, MD  Discharge Date:  12/27/2019   Primary MD  Curlene Labrum, MD  Recommendations for primary care physician for things to follow:   1) please take aspirin 81 mg with food daily for stroke prevention 2) please increase Crestor to 15 mg daily from 10 mg to better control your cholesterol 3) please take your blood pressure medications as prescribed 4) low-salt and low-cholesterol diet advised 5)Avoid ibuprofen/Advil/Aleve/Motrin/Goody Powders/Naproxen/BC powders/Meloxicam/Diclofenac/Indomethacin and other Nonsteroidal anti-inflammatory medications as these will make you more likely to bleed and can cause stomach ulcers, can also cause Kidney problems.  6) please follow-up with your primary care physician in 1 to 2 weeks for recheck and reevaluation  Admission Diagnosis  TIA (transient ischemic attack) [G45.9] Elevated blood pressure reading with diagnosis of hypertension [I10]  Discharge Diagnosis  TIA (transient ischemic attack) [G45.9] Elevated blood pressure reading with diagnosis of hypertension [I10]    Principal Problem:   TIA (transient ischemic attack)/Rt Sided Facial Numbness Active Problems:   HTN (hypertension)   HLD (hyperlipidemia)      Past Medical History:  Diagnosis Date  . Fibroids   . High cholesterol   . Hypertension     Past Surgical History:  Procedure Laterality Date  . ABDOMINAL HYSTERECTOMY       HPI  from the history and physical done on the day of admission:    HPI: Rita Marsh is a 74 y.o. female with medical history significant of hypertension, hyperlipidemia who presented to the ER with right facial numbness.  Patient states the right side of her face got numb after supper at around 8 PM.  She was worried that her  blood pressure was elevated and checked and found it was 189/94.  She continued to have facial numbness for about an hour and decided to come to the ER for further evaluation.  Symptoms resolved by themselves in the ER.  Patient states she feels back to baseline.  She did not have any dizziness, lightheadedness, visual changes, focal neurological deficits, difficulty with ambulation, dysphagia, ataxia, sensory deficits, nausea, vomiting, diarrhea, chest pain, shortness of breath, palpitations, dysuria, fever, chills.  She is not on antiplatelet agents currently.  Does take blood pressure medication at home.  Reports that her TSH was apparently high but she supposed to follow-up with her primary care provider and get a referral. Rita Course:  Vital Signs reviewed on presentation, significant for temperature 30.5, heart rate 66, blood pressure 147/62, saturation 97% on room air. Labs reviewed, significant for sodium 137, potassium 3.9, BUN 26, creatinine 0.9, LFTs within normal limits, WBC count 6.8, hemoglobin 13.3, hematocrit 41, Imaging personally Reviewed, CT of the head shows no acute intracranial abnormalities. EKG personally reviewed, shows sinus rhythm, no acute ST changes.     Hospital Course:    1)Possible TIA with Elevated BP and Right-sided facial numbness--- resolved, MRI brain  and CT head negative for acute stroke -echo with EF of 60 to 65%. The left ventricle has normal function. The left ventricle has no regional wall motion abnormalities. Left ventricular diastolic parameters were  normal.  -Carotid artery Dopplers without hemodynamically significant stenosis -Patient's bps normalized -Right-sided facial numbness resolved completely and did not reoccur - -Discharge home on baby aspirin with food, increase Crestor to 15 mg from 10 mg as LDL is 88 (optional goal < 70) -HDL is 55 with total cholesterol of 157 and triglycerides of 69 -A1c 6.1, UDS negative, TSH is 3.55 -CMP and CBC  largely unremarkable  --Patient reevaluated with husband at bedside, per patient and per husband she is completely back to baseline cognitively, physically and otherwise  Discharge Condition:  stable  Follow UP--- PCP in 1 to 2 weeks for recheck Follow-up Information    Burdine, Virgina Evener, MD. Schedule an appointment as soon as possible for a visit in 2 week(s).   Specialty: Family Medicine Why: RECHECK Contact information: [place] [address] [phone]            Consults obtained - na  Diet and Activity recommendation:  As advised  Discharge Instructions    Discharge Instructions    Call MD for:  difficulty breathing, headache or visual disturbances   Complete by: As directed    Call MD for:  persistant dizziness or light-headedness   Complete by: As directed    Call MD for:  persistant nausea and vomiting   Complete by: As directed    Call MD for:  severe uncontrolled pain   Complete by: As directed    Call MD for:  temperature >100.4   Complete by: As directed    Diet - low sodium heart healthy   Complete by: As directed    Discharge instructions   Complete by: As directed    1) please take aspirin 81 mg with food daily for stroke prevention 2) please increase Crestor to 15 mg daily from 10 mg to better control your cholesterol 3) please take your blood pressure medications as prescribed 4) low-salt and low-cholesterol diet advised 5)Avoid ibuprofen/Advil/Aleve/Motrin/Goody Powders/Naproxen/BC powders/Meloxicam/Diclofenac/Indomethacin and other Nonsteroidal anti-inflammatory medications as these will make you more likely to bleed and can cause stomach ulcers, can also cause Kidney problems.  6) please follow-up with your primary care physician in 1 to 2 weeks for recheck and reevaluation   Increase activity slowly   Complete by: As directed        Discharge Medications     Allergies as of 12/27/2019   No Known Allergies     Medication List      TAKE these medications   aspirin 81 MG EC tablet Take 1 tablet (81 mg total) by mouth daily with breakfast.   augmented betamethasone dipropionate 0.05 % cream Commonly known as: DIPROLENE-AF Apply 1 application topically in the morning and at bedtime. For legs   calcium carbonate 500 MG chewable tablet Commonly known as: TUMS - dosed in mg elemental calcium Chew 1 tablet by mouth daily as needed for indigestion or heartburn.   CALCIUM PO Take by mouth daily.   CLARITIN PO Take by mouth as needed.   FISH OIL PO Take by mouth daily.   fluticasone 50 MCG/ACT nasal spray Commonly known as: FLONASE Place 2 sprays into both nostrils as needed for allergies.   losartan 25 MG tablet Commonly known as: COZAAR Take 1 tablet (25 mg total) by mouth daily.  multivitamin tablet Take 1 tablet by mouth daily.   REFRESH DRY EYE THERAPY OP Apply 1 drop to eye daily as needed (for dry eyes).   rosuvastatin 10 MG tablet Commonly known as: CRESTOR Take 1.5 tablets (15 mg total) by mouth daily. What changed: how much to take      Major procedures and Radiology Reports - PLEASE review detailed and final reports for all details, in brief -   CT HEAD WO CONTRAST  Result Date: 12/27/2019 CLINICAL DATA:  Hypertension, headache, TIA EXAM: CT HEAD WITHOUT CONTRAST TECHNIQUE: Contiguous axial images were obtained from the base of the skull through the vertex without intravenous contrast. COMPARISON:  None. FINDINGS: Brain: No acute intracranial abnormality. Specifically, no hemorrhage, hydrocephalus, mass lesion, acute infarction, or significant intracranial injury. Vascular: No hyperdense vessel or unexpected calcification. Skull: No acute calvarial abnormality. Sinuses/Orbits: Visualized paranasal sinuses and mastoids clear. Orbital soft tissues unremarkable. Other: None IMPRESSION: No acute intracranial abnormality. Electronically Signed   By: Rolm Baptise M.D.   On: 12/27/2019 00:47   MR  BRAIN WO CONTRAST  Result Date: 12/27/2019 CLINICAL DATA:  Right facial numbness EXAM: MRI HEAD WITHOUT CONTRAST TECHNIQUE: Multiplanar, multiecho pulse sequences of the brain and surrounding structures were obtained without intravenous contrast. COMPARISON:  None. FINDINGS: Brain: There is no acute infarction or intracranial hemorrhage. There is no intracranial mass, mass effect, or edema. There is no hydrocephalus or extra-axial fluid collection. Ventricles and sulci are within normal limits in size and configuration. Few small foci of T2 hyperintensity in the supratentorial white matter, which are nonspecific but may reflect minor chronic microvascular ischemic changes. Small focus of susceptibility adjacent to the right occipital horn compatible with chronic microhemorrhage or mineralization. Vascular: Major vessel flow voids at the skull base are preserved. Skull and upper cervical spine: Normal marrow signal is preserved. Sinuses/Orbits: Paranasal sinuses are aerated. Orbits are unremarkable. Other: Sella is unremarkable.  Mastoid air cells are clear. IMPRESSION: No evidence of recent infarction, hemorrhage, or mass. Minor chronic microvascular ischemic changes. Electronically Signed   By: Macy Mis M.D.   On: 12/27/2019 10:44   US Carotid Bilateral (at [facility] and [facility] only)  Result Date: 12/27/2019 CLINICAL DATA:  Right facial numbness. EXAM: BILATERAL CAROTID DUPLEX ULTRASOUND TECHNIQUE: Pearline Cables scale imaging, color Doppler and duplex ultrasound were performed of bilateral carotid and vertebral arteries in the neck. COMPARISON:  None. FINDINGS: Criteria: Quantification of carotid stenosis is based on velocity parameters that correlate the residual internal carotid diameter with NASCET-based stenosis levels, using the diameter of the distal internal carotid lumen as the denominator for stenosis measurement. The following velocity measurements were obtained: RIGHT ICA: 93/26 cm/sec CCA: [id number] cm/sec  SYSTOLIC ICA/CCA RATIO:  0.8 ECA: 100 cm/sec LEFT ICA: 100/25 cm/sec CCA: [id number] cm/sec SYSTOLIC ICA/CCA RATIO:  0.9 ECA: 94 cm/sec RIGHT CAROTID ARTERY: External carotid artery is patent with normal waveform. Echogenic plaque with shadowing in the proximal internal carotid artery. Normal waveforms and velocities in the internal carotid artery. Distal internal carotid artery is not visualized. RIGHT VERTEBRAL ARTERY: Antegrade flow and normal waveform in the right vertebral artery. LEFT CAROTID ARTERY: External carotid artery is patent with normal waveform. Small amount of plaque in the left internal carotid artery. Normal waveforms and velocities in the internal carotid artery. Distal left internal carotid artery is not visualized. LEFT VERTEBRAL ARTERY: Antegrade flow and normal waveform in the left vertebral artery. 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 Estimated degree of stenosis in the  internal carotid arteries is less than 50% bilaterally. Patent vertebral arteries with antegrade flow. Electronically Signed   By: Markus Daft M.D.   On: 12/27/2019 13:02   ECHOCARDIOGRAM COMPLETE  Result Date: 12/27/2019    ECHOCARDIOGRAM REPORT   Patient Name:   Rita Marsh Date of Exam: 12/27/2019 Medical Rec #:  [id number]         Height:       65.0 in Accession #:    [id number]        Weight:       218.0 lb Date of Birth:  07-May-1946        BSA:          2.052 m Patient Age:    29 years          BP:           139/76 mmHg Patient Gender: F                 HR:           74 bpm. Exam Location:  [facility] Procedure: 2D Echo Indications:    TIA 435.9 / G45.9  History:        Patient has no prior history of Echocardiogram examinations.                 TIA; Risk Factors:Hypertension, Dyslipidemia and Former Smoker.  Sonographer:    Leavy Cella RDCS (AE) Referring Phys: [place]  1. Left ventricular ejection fraction, by estimation, is 60 to 65%.  The left ventricle has normal function. The left ventricle has no regional wall motion abnormalities. Left ventricular diastolic parameters were normal.  2. Right ventricular systolic function is normal. The right ventricular size is normal.  3. The mitral valve is normal in structure. Trivial mitral valve regurgitation. No evidence of mitral stenosis.  4. The aortic valve is tricuspid. Aortic valve regurgitation is not visualized. No aortic stenosis is present.  5. The inferior vena cava is normal in size with greater than 50% respiratory variability, suggesting right atrial pressure of 3 mmHg. FINDINGS  Left Ventricle: Left ventricular ejection fraction, by estimation, is 60 to 65%. The left ventricle has normal function. The left ventricle has no regional wall motion abnormalities. The left ventricular internal cavity size was normal in size. There is  no left ventricular hypertrophy. Left ventricular diastolic parameters were normal. Right Ventricle: The right ventricular size is normal. No increase in right ventricular wall thickness. Right ventricular systolic function is normal. Left Atrium: Left atrial size was normal in size. Right Atrium: Right atrial size was normal in size. Pericardium: There is no evidence of pericardial effusion. Mitral Valve: The mitral valve is normal in structure. Trivial mitral valve regurgitation. No evidence of mitral valve stenosis. Tricuspid Valve: The tricuspid valve is normal in structure. Tricuspid valve regurgitation is not demonstrated. No evidence of tricuspid stenosis. Aortic Valve: The aortic valve is tricuspid. Aortic valve regurgitation is not visualized. No aortic stenosis is present. Aortic valve mean gradient measures 2.4 mmHg. Aortic valve peak gradient measures 4.2 mmHg. Aortic valve area, by VTI measures 2.78 cm. Pulmonic Valve: The pulmonic valve was not well visualized. Pulmonic valve regurgitation is not visualized. No evidence of pulmonic stenosis. Aorta:  The aortic root is normal in size and structure. Pulmonary Artery: Indeterminant PASP, inadequate TR jet. Venous: The inferior vena cava is normal in size with greater than 50% respiratory variability, suggesting right atrial pressure of 3 mmHg.  IAS/Shunts: No atrial level shunt detected by color flow Doppler.  LEFT VENTRICLE PLAX 2D LVIDd:         4.63 cm  Diastology LVIDs:         2.58 cm  LV e' lateral:   12.70 cm/s LV PW:         0.82 cm  LV E/e' lateral: 8.0 LV IVS:        0.86 cm  LV e' medial:    8.81 cm/s LVOT diam:     1.90 cm  LV E/e' medial:  11.5 LV SV:         69 LV SV Index:   34 LVOT Area:     2.84 cm  RIGHT VENTRICLE RV S prime:     12.20 cm/s TAPSE (M-mode): 2.2 cm LEFT ATRIUM           Index LA diam:      3.60 cm 1.75 cm/m LA Vol (A2C): 41.1 ml 20.03 ml/m LA Vol (A4C): 30.5 ml 14.86 ml/m  AORTIC VALVE AV Area (Vmax):    3.20 cm AV Area (Vmean):   2.87 cm AV Area (VTI):     2.78 cm AV Vmax:           102.39 cm/s AV Vmean:          75.012 cm/s AV VTI:            0.248 m AV Peak Grad:      4.2 mmHg AV Mean Grad:      2.4 mmHg LVOT Vmax:         115.69 cm/s LVOT Vmean:        76.005 cm/s LVOT VTI:          0.244 m LVOT/AV VTI ratio: 0.98  AORTA Ao Root diam: 3.00 cm MITRAL VALVE MV Area (PHT): 3.43 cm     SHUNTS MV Decel Time: 221 msec     Systemic VTI:  0.24 m MR Peak grad: 120.1 mmHg    Systemic Diam: 1.90 cm MR Vmax:      548.00 cm/s MV E velocity: 101.00 cm/s MV A velocity: 70.30 cm/s MV E/A ratio:  1.44 Carlyle Dolly MD Electronically signed by Carlyle Dolly MD Signature Date/Time: 12/27/2019/1:02:16 PM    Final    Micro Results   Recent Results (from the past 240 hour(s))  SARS CORONAVIRUS 2 (TAT 6-24 HRS) Nasopharyngeal Urine, Clean Catch     Status: None   Collection Time: 12/26/19 11:54 PM   Specimen: Urine, Clean Catch; Nasopharyngeal  Result Value Ref Range Status   SARS Coronavirus 2 NEGATIVE NEGATIVE Final    Comment: (NOTE) SARS-CoV-2 target nucleic acids are NOT  DETECTED. The SARS-CoV-2 RNA is generally detectable in upper and lower respiratory specimens during the acute phase of infection. Negative results do not preclude SARS-CoV-2 infection, do not rule out co-infections with other pathogens, and should not be used as the sole basis for treatment or other patient management decisions. Negative results must be combined with clinical observations, patient history, and epidemiological information. The expected result is Negative. Fact Sheet for Patients: SugarRoll.be Fact Sheet for Healthcare Providers: https://www.woods-mathews.com/ This test is not yet approved or cleared by the [place] FDA and  has been authorized for detection and/or diagnosis of SARS-CoV-2 by FDA under an Emergency Use Authorization (EUA). This EUA will remain  in effect (meaning this test can be used) for the duration of the COVID-19 declaration under Section 56 4(b)(1) of the  Act, 21 U.S.C. section 360bbb-3(b)(1), unless the authorization is terminated or revoked sooner. Performed at [facility], [place] [address] [place], [place] [address]    Today   Subjective    Rita Marsh today has no new complaints, -Husband at bedside, patient requesting discharge home --Eating and drinking well, ambulating around the room without dizziness shortness of breath chest pains or palpitations  --No gait concerns no further numbness or weakness concerns --  --Patient reevaluated with husband at bedside, per patient and per husband she is completely back to baseline cognitively, physically and otherwise         Patient has been seen and examined prior to discharge   Objective   Blood pressure 139/76, pulse 74, temperature 98.1 F (36.7 C), temperature source Oral, resp. rate 16, height 5\' 5"  (1.651 m), weight 98.9 kg, SpO2 99 %.  No intake or output data in the 24 hours ending 12/27/19 1327  Exam Gen:- Awake Alert,  no acute distress  HEENT:- [place].AT, No sclera icterus Neck-Supple Neck,No JVD,.  Lungs-  CTAB , good air movement bilaterally  CV- S1, S2 normal, regular Abd-  +ve B.Sounds, Abd Soft, No tenderness,    Extremity/Skin:- No  edema,   good pulses Psych-affect is appropriate, oriented x3 Neuro-no new focal deficits, no tremors    Data Review   CBC w Diff:  Lab Results  Component Value Date   WBC 6.8 12/26/2019   HGB 13.3 12/26/2019   HCT 41.6 12/26/2019   PLT 300 12/26/2019   LYMPHOPCT 31 12/26/2019   MONOPCT 9 12/26/2019   EOSPCT 1 12/26/2019   BASOPCT 0 12/26/2019    CMP:  Lab Results  Component Value Date   NA 137 12/26/2019   K 3.9 12/26/2019   CL 103 12/26/2019   CO2 26 12/26/2019   BUN 21 12/26/2019   CREATININE 0.95 12/26/2019   PROT 8.5 (H) 12/26/2019   ALBUMIN 4.4 12/26/2019   BILITOT 0.6 12/26/2019   ALKPHOS 59 12/26/2019   AST 25 12/26/2019   ALT 17 12/26/2019  .   Total Discharge time is about 33 minutes  Roxan Hockey M.D on 12/27/2019 at 1:27 PM  Go to [email or web address] -  for contact info  [facility] - Office  [phone]

## 2019-12-27 NOTE — Discharge Instructions (Signed)
1) please take aspirin 81 mg with food daily for stroke prevention 2) please increase Crestor to 15 mg daily from 10 mg to better control your cholesterol 3) please take your blood pressure medications as prescribed 4) low-salt and low-cholesterol diet advised 5)Avoid ibuprofen/Advil/Aleve/Motrin/Goody Powders/Naproxen/BC powders/Meloxicam/Diclofenac/Indomethacin and other Nonsteroidal anti-inflammatory medications as these will make you more likely to bleed and can cause stomach ulcers, can also cause Kidney problems.  6) please follow-up with your primary care physician in 1 to 2 weeks for recheck and reevaluation

## 2019-12-27 NOTE — H&P (Signed)
History and Physical    Patient Demographics:    Rita Marsh [id number] DOB: 02-Jun-1946 DOA: 12/26/2019  PCP: Curlene Labrum, MD  Patient coming from: Home  I have personally briefly reviewed patient's old medical records in [place]  Chief Complaint: Right facial numbness   Assessment & Plan:     Assessment/Plan Principal Problem:   TIA (transient ischemic attack) Active Problems:   HTN (hypertension)   HLD (hyperlipidemia)     Principal Problem: Right facial numbness, concerning for TIA Patient presented with right facial numbness, elevated blood pressures.  Symptoms lasted for about an hour and resolved.  No other focal neurological deficits.  Exam was benign at the time of my evaluation.  CT head negative.  Symptoms possibly secondary to uncontrolled hypertension but cannot completely rule out TIA. -Telemetry monitoring -Neurochecks -MRI brain, echocardiogram, carotid Doppler -We will place on aspirin -Continue statin -Allow for permissive hypertension -Consider neurology evaluation if needed  Other Active Problems: Hypertension Allow for permissive hypertension  Hyperlipidemia Continue Crestor, Lovaza  DVT prophylaxis: Lovenox Code Status:  Full code Family Communication: N/A  Disposition Plan: Place in observation Consults called: N/A Admission status: Observation stay    HPI:     HPI: Rita Marsh is a 74 y.o. female with medical history significant of hypertension, hyperlipidemia who presented to the ER with right facial numbness.  Patient states the right side of her face got numb after supper at around 8 PM.  She was worried that her blood pressure was elevated and checked and found it was 189/94.  She continued to have facial numbness for about an hour and decided to come to the ER for further evaluation.  Symptoms resolved by themselves in the ER.  Patient states she feels back to baseline.  She did not have any dizziness,  lightheadedness, visual changes, focal neurological deficits, difficulty with ambulation, dysphagia, ataxia, sensory deficits, nausea, vomiting, diarrhea, chest pain, shortness of breath, palpitations, dysuria, fever, chills.  She is not on antiplatelet agents currently.  Does take blood pressure medication at home.  Reports that her TSH was apparently high but she supposed to follow-up with her primary care provider and get a referral. ED Course:  Vital Signs reviewed on presentation, significant for temperature 30.5, heart rate 66, blood pressure 147/62, saturation 97% on room air. Labs reviewed, significant for sodium 137, potassium 3.9, BUN 26, creatinine 0.9, LFTs within normal limits, WBC count 6.8, hemoglobin 13.3, hematocrit 41, Imaging personally Reviewed, CT of the head shows no acute intracranial abnormalities. EKG personally reviewed, shows sinus rhythm, no acute ST changes.    Review of systems:    Review of Systems: As per HPI otherwise 10 point review of systems negative.  All other review of systems is negative except the ones noted above in the HPI.    Past Medical and Surgical History:  Reviewed by me  Past Medical History:  Diagnosis Date  . Fibroids   . High cholesterol   . Hypertension     Past Surgical History:  Procedure Laterality Date  . ABDOMINAL HYSTERECTOMY       Social History:  Reviewed by me   reports that she has quit smoking. Her smoking use included cigarettes. She quit after 9.00 years of use. She has never used smokeless tobacco. She reports that she does not drink alcohol or use drugs.  Allergies:    No Known Allergies  Family History :   Family History  Problem Relation Age  of Onset  . Other Paternal Grandmother        hit by car  . Heart attack Father   . High Cholesterol Mother   . Hypertension Mother   . Hypertension Brother   . High Cholesterol Brother    Family history reviewed, noted as above, not pertinent to current  presentation.   Home Medications:    Prior to Admission medications   Medication Sig Start Date End Date Taking? Authorizing Provider  CALCIUM PO Take by mouth daily.    [provider]  fluticasone (FLONASE) 50 MCG/ACT nasal spray Place 2 sprays into both nostrils as needed.  08/29/17   [provider]  Loratadine (CLARITIN PO) Take by mouth as needed.    [provider]  losartan (COZAAR) 25 MG tablet Take 25 mg by mouth daily.  09/26/17   [provider]  Multiple Vitamin (MULTIVITAMIN) tablet Take 1 tablet by mouth daily.    [provider]  Omega-3 Fatty Acids (FISH OIL PO) Take by mouth daily.    [provider]  rosuvastatin (CRESTOR) 20 MG tablet Take 10 mg by mouth daily.     [provider]    Physical Exam:    Physical Exam: Vitals:   12/26/19 2134 12/26/19 2135 12/26/19 2300  BP: (!) 189/77  (!) 152/79  Pulse: (!) 106    Resp: 16    Temp: 98.5 F (36.9 C)    TempSrc: Oral    SpO2: 100%    Weight:  98.9 kg   Height:  5\' 5"  (1.651 m)     Constitutional: NAD, calm, comfortable Vitals:   12/26/19 2134 12/26/19 2135 12/26/19 2300  BP: (!) 189/77  (!) 152/79  Pulse: (!) 106    Resp: 16    Temp: 98.5 F (36.9 C)    TempSrc: Oral    SpO2: 100%    Weight:  98.9 kg   Height:  5\' 5"  (1.651 m)    Eyes: PERRL, lids and conjunctivae normal ENMT: Mucous membranes are moist. Posterior pharynx clear of any exudate or lesions.Normal dentition.  Neck: normal, supple, no masses, no thyromegaly Respiratory: clear to auscultation bilaterally, no wheezing, no crackles. Normal respiratory effort. No accessory muscle use.  Cardiovascular: Regular rate and rhythm, no murmurs / rubs / gallops. No extremity edema. 2+ pedal pulses. No carotid bruits.  Abdomen: no tenderness, no masses palpated. No hepatosplenomegaly. Bowel sounds positive.  Musculoskeletal: no clubbing / cyanosis. No joint deformity upper and lower  extremities. Good ROM, no contractures. Normal muscle tone.  Skin: no rashes, lesions, ulcers. No induration Neurologic: CN 2-12 grossly intact. Sensation intact, DTR normal. Strength 5/5 in all 4.  Psychiatric: Normal judgment and insight. Alert and oriented x 3. Normal mood.    Decubitus Ulcers: Not present on admission Catheters and tubes: None  Data Review:    Labs on Admission: I have personally reviewed following labs and imaging studies  CBC: Recent Labs  Lab 12/26/19 2357  WBC 6.8  NEUTROABS 4.0  HGB 13.3  HCT 41.6  MCV 91.8  PLT [id number]   Basic Metabolic Panel: Recent Labs  Lab 12/26/19 2357  NA 137  K 3.9  CL 103  CO2 26  GLUCOSE 106*  BUN 21  CREATININE 0.95  CALCIUM 9.2   GFR: Estimated Creatinine Clearance: 63.3 mL/min (by C-G formula based on SCr of 0.95 mg/dL). Liver Function Tests: Recent Labs  Lab 12/26/19 2357  AST 25  ALT 17  ALKPHOS 59  BILITOT 0.6  PROT 8.5*  ALBUMIN 4.4   No results for input(s): LIPASE, AMYLASE in the last 168 hours. No results for input(s): AMMONIA in the last 168 hours. Coagulation Profile: Recent Labs  Lab 12/26/19 2357  INR 1.0   Cardiac Enzymes: No results for input(s): CKTOTAL, CKMB, CKMBINDEX, TROPONINI in the last 168 hours. BNP (last 3 results) No results for input(s): PROBNP in the last 8760 hours. HbA1C: No results for input(s): HGBA1C in the last 72 hours. CBG: No results for input(s): GLUCAP in the last 168 hours. Lipid Profile: No results for input(s): CHOL, HDL, LDLCALC, TRIG, CHOLHDL, LDLDIRECT in the last 72 hours. Thyroid Function Tests: No results for input(s): TSH, T4TOTAL, FREET4, T3FREE, THYROIDAB in the last 72 hours. Anemia Panel: No results for input(s): VITAMINB12, FOLATE, FERRITIN, TIBC, IRON, RETICCTPCT in the last 72 hours. Urine analysis:    Component Value Date/Time   COLORURINE STRAW (A) 12/27/2019 0000   APPEARANCEUR CLEAR 12/27/2019 0000   LABSPEC 1.006 12/27/2019 0000    PHURINE 7.0 12/27/2019 0000   GLUCOSEU NEGATIVE 12/27/2019 0000   HGBUR SMALL (A) 12/27/2019 0000   BILIRUBINUR NEGATIVE 12/27/2019 0000   KETONESUR NEGATIVE 12/27/2019 0000   PROTEINUR NEGATIVE 12/27/2019 0000   NITRITE NEGATIVE 12/27/2019 0000   LEUKOCYTESUR SMALL (A) 12/27/2019 0000     Imaging Results:      Radiological Exams on Admission: CT HEAD WO CONTRAST  Result Date: 12/27/2019 CLINICAL DATA:  Hypertension, headache, TIA EXAM: CT HEAD WITHOUT CONTRAST TECHNIQUE: Contiguous axial images were obtained from the base of the skull through the vertex without intravenous contrast. COMPARISON:  None. FINDINGS: Brain: No acute intracranial abnormality. Specifically, no hemorrhage, hydrocephalus, mass lesion, acute infarction, or significant intracranial injury. Vascular: No hyperdense vessel or unexpected calcification. Skull: No acute calvarial abnormality. Sinuses/Orbits: Visualized paranasal sinuses and mastoids clear. Orbital soft tissues unremarkable. Other: None IMPRESSION: No acute intracranial abnormality. Electronically Signed   By: Rolm Baptise M.D.   On: 12/27/2019 00:47      Shalae Belmonte Ginette Otto MD [facility]  If 7PM-7AM, please contact night-coverage   12/27/2019, 3:31 AM

## 2020-01-26 DIAGNOSIS — Z6836 Body mass index (BMI) 36.0-36.9, adult: Secondary | ICD-10-CM | POA: Diagnosis not present

## 2020-01-26 DIAGNOSIS — E782 Mixed hyperlipidemia: Secondary | ICD-10-CM | POA: Diagnosis not present

## 2020-01-26 DIAGNOSIS — K219 Gastro-esophageal reflux disease without esophagitis: Secondary | ICD-10-CM | POA: Diagnosis not present

## 2020-01-26 DIAGNOSIS — E039 Hypothyroidism, unspecified: Secondary | ICD-10-CM | POA: Diagnosis not present

## 2020-01-26 DIAGNOSIS — I10 Essential (primary) hypertension: Secondary | ICD-10-CM | POA: Diagnosis not present

## 2020-01-26 DIAGNOSIS — G459 Transient cerebral ischemic attack, unspecified: Secondary | ICD-10-CM | POA: Diagnosis not present

## 2020-02-01 DIAGNOSIS — E7849 Other hyperlipidemia: Secondary | ICD-10-CM | POA: Diagnosis not present

## 2020-02-01 DIAGNOSIS — I10 Essential (primary) hypertension: Secondary | ICD-10-CM | POA: Diagnosis not present

## 2020-02-07 DIAGNOSIS — E039 Hypothyroidism, unspecified: Secondary | ICD-10-CM | POA: Diagnosis not present

## 2020-02-07 DIAGNOSIS — I10 Essential (primary) hypertension: Secondary | ICD-10-CM | POA: Diagnosis not present

## 2020-02-07 DIAGNOSIS — K219 Gastro-esophageal reflux disease without esophagitis: Secondary | ICD-10-CM | POA: Diagnosis not present

## 2020-02-07 DIAGNOSIS — R946 Abnormal results of thyroid function studies: Secondary | ICD-10-CM | POA: Diagnosis not present

## 2020-02-07 DIAGNOSIS — E782 Mixed hyperlipidemia: Secondary | ICD-10-CM | POA: Diagnosis not present

## 2020-02-11 DIAGNOSIS — Z6835 Body mass index (BMI) 35.0-35.9, adult: Secondary | ICD-10-CM | POA: Diagnosis not present

## 2020-02-11 DIAGNOSIS — Z0001 Encounter for general adult medical examination with abnormal findings: Secondary | ICD-10-CM | POA: Diagnosis not present

## 2020-02-11 DIAGNOSIS — E039 Hypothyroidism, unspecified: Secondary | ICD-10-CM | POA: Diagnosis not present

## 2020-02-11 DIAGNOSIS — G459 Transient cerebral ischemic attack, unspecified: Secondary | ICD-10-CM | POA: Diagnosis not present

## 2020-02-11 DIAGNOSIS — E782 Mixed hyperlipidemia: Secondary | ICD-10-CM | POA: Diagnosis not present

## 2020-02-11 DIAGNOSIS — K219 Gastro-esophageal reflux disease without esophagitis: Secondary | ICD-10-CM | POA: Diagnosis not present

## 2020-02-11 DIAGNOSIS — I10 Essential (primary) hypertension: Secondary | ICD-10-CM | POA: Diagnosis not present

## 2020-03-11 DIAGNOSIS — Z23 Encounter for immunization: Secondary | ICD-10-CM | POA: Diagnosis not present

## 2020-03-24 ENCOUNTER — Ambulatory Visit: Payer: PPO | Admitting: Adult Health

## 2020-03-24 ENCOUNTER — Telehealth: Payer: Self-pay | Admitting: Adult Health

## 2020-03-24 ENCOUNTER — Encounter: Payer: Self-pay | Admitting: Adult Health

## 2020-03-24 VITALS — BP 141/75 | HR 79 | Ht 64.0 in | Wt 202.0 lb

## 2020-03-24 DIAGNOSIS — Z4689 Encounter for fitting and adjustment of other specified devices: Secondary | ICD-10-CM | POA: Diagnosis not present

## 2020-03-24 DIAGNOSIS — N939 Abnormal uterine and vaginal bleeding, unspecified: Secondary | ICD-10-CM | POA: Diagnosis not present

## 2020-03-24 DIAGNOSIS — R350 Frequency of micturition: Secondary | ICD-10-CM

## 2020-03-24 DIAGNOSIS — N812 Incomplete uterovaginal prolapse: Secondary | ICD-10-CM | POA: Diagnosis not present

## 2020-03-24 LAB — POCT URINALYSIS DIPSTICK
Blood, UA: POSITIVE
Glucose, UA: NEGATIVE
Ketones, UA: POSITIVE
Leukocytes, UA: NEGATIVE
Nitrite, UA: NEGATIVE

## 2020-03-24 NOTE — Progress Notes (Signed)
  Subjective:     Patient ID: Rita Marsh, female   DOB: Mar 17, 1946, 74 y.o.   MRN: [id number]  HPI Rita Marsh is a 74 year old black female, married, sp hysterectomy in complaing of vaginal bleeding since last night has pessary. Has noticed urinary frequency today and ?odor. PCP is Dr Pleas Koch.   Review of Systems +vaginal bleeding since last night +urainry frequency and ?odor  Back aches some  Reviewed past medical,surgical, social and family history. Reviewed medications and allergies.     Objective:   Physical Exam BP (!) 141/75 (BP Location: Left Arm, Patient Position: Sitting, Cuff Size: Normal)   Pulse 79   Ht 5\' 4"  (1.626 m)   Wt 202 lb (91.6 kg)   BMI 34.67 kg/m urine dipstick is positive for blood.   Skin warm and dry.Pelvic: external genitalia is normal in appearance no lesions, vagina:pessary removed and cleaned, red blood without odor, no lesions seen, cuff looks good, +cycstocele,urethra has no lesions or masses noted, cervix and uterus are absent, adnexa: no masses or tenderness noted. Bladder is non tender and no masses felt. Pessary #4 reinserted.  Co exam with Dr Elonda Husky. Examination chaperoned by Rita Squibb LPN    Assessment:     1. Vaginal bleeding -just watch for now \ 2. Cystocele with incomplete uterovaginal prolapse  3. Pessary maintenance  4. Urinary frequency UA C&S sent Will talk when results back     Plan:     Follow up prn

## 2020-03-24 NOTE — Telephone Encounter (Signed)
Having some bleeding was on pessary when she took out last night please call and advise

## 2020-03-24 NOTE — Telephone Encounter (Signed)
Pt seeing blood on pessary to come in at 3:50 pm today to be checked

## 2020-03-25 DIAGNOSIS — R350 Frequency of micturition: Secondary | ICD-10-CM | POA: Diagnosis not present

## 2020-03-26 LAB — URINALYSIS, ROUTINE W REFLEX MICROSCOPIC
Bilirubin, UA: NEGATIVE
Glucose, UA: NEGATIVE
Ketones, UA: NEGATIVE
Leukocytes,UA: NEGATIVE
Nitrite, UA: NEGATIVE
Protein,UA: NEGATIVE
Specific Gravity, UA: 1.021 (ref 1.005–1.030)
Urobilinogen, Ur: 0.2 mg/dL (ref 0.2–1.0)
pH, UA: 5.5 (ref 5.0–7.5)

## 2020-03-26 LAB — MICROSCOPIC EXAMINATION
Bacteria, UA: NONE SEEN
Casts: NONE SEEN /lpf
Epithelial Cells (non renal): NONE SEEN /hpf (ref 0–10)
WBC, UA: NONE SEEN /hpf (ref 0–5)

## 2020-03-27 ENCOUNTER — Telehealth: Payer: Self-pay | Admitting: Adult Health

## 2020-03-27 LAB — URINE CULTURE: Organism ID, Bacteria: NO GROWTH

## 2020-03-27 NOTE — Telephone Encounter (Signed)
Pt aware that urine had no growth and she says bleeding is better

## 2020-04-08 DIAGNOSIS — Z23 Encounter for immunization: Secondary | ICD-10-CM | POA: Diagnosis not present

## 2020-04-18 ENCOUNTER — Telehealth: Payer: Self-pay | Admitting: Adult Health

## 2020-04-18 NOTE — Telephone Encounter (Signed)
Patient states that she is having problems same as three weeks ago and would like someone to follow up with her on her recurring symptoms.

## 2020-04-18 NOTE — Telephone Encounter (Signed)
Vaginal spotting started back up 2 days ago. Heavier last pm. Wanted to let you know. [place]

## 2020-04-18 NOTE — Telephone Encounter (Signed)
Left message letting pt know if bleeding continues over the weekend, call Monday for appt per Jenn. [place]

## 2020-04-21 DIAGNOSIS — Z1231 Encounter for screening mammogram for malignant neoplasm of breast: Secondary | ICD-10-CM | POA: Diagnosis not present

## 2020-04-24 DIAGNOSIS — N951 Menopausal and female climacteric states: Secondary | ICD-10-CM | POA: Diagnosis not present

## 2020-04-24 DIAGNOSIS — E039 Hypothyroidism, unspecified: Secondary | ICD-10-CM | POA: Diagnosis not present

## 2020-04-24 DIAGNOSIS — Z8349 Family history of other endocrine, nutritional and metabolic diseases: Secondary | ICD-10-CM | POA: Diagnosis not present

## 2020-04-24 DIAGNOSIS — E669 Obesity, unspecified: Secondary | ICD-10-CM | POA: Diagnosis not present

## 2020-05-28 DIAGNOSIS — R590 Localized enlarged lymph nodes: Secondary | ICD-10-CM | POA: Diagnosis not present

## 2020-05-28 DIAGNOSIS — N6332 Unspecified lump in axillary tail of the left breast: Secondary | ICD-10-CM | POA: Diagnosis not present

## 2020-08-11 ENCOUNTER — Telehealth: Payer: Self-pay | Admitting: Obstetrics & Gynecology

## 2020-08-11 ENCOUNTER — Telehealth: Payer: Self-pay | Admitting: *Deleted

## 2020-08-11 NOTE — Telephone Encounter (Signed)
Patient states that she is still having some spotting and would like to speak to someone about it. Pt states she normally sees jennifer explained to patient jg is out of the office this week.

## 2020-08-11 NOTE — Telephone Encounter (Addendum)
Patient states she is having vaginal bleeding again. She took her pessary out yesterday and noticed bright red blood.  She was seen back in June with Rita Marsh for the same issue and had co exam with Dr Elonda Husky (see note from 03/24/20) and was advised to watch for now. Patient is concerned and wants to know what she needs to do. Please advise.

## 2020-08-12 ENCOUNTER — Telehealth: Payer: Self-pay | Admitting: *Deleted

## 2020-08-12 NOTE — Telephone Encounter (Signed)
LMOVM that I did speak with Dr Elonda Husky and there can be some blood on the pessary.  He recommends her being seen on a regular basis to have it cleaned and examined.

## 2020-08-12 NOTE — Telephone Encounter (Signed)
That is not an unusual issue with a pessary  I examined her earlier as you stated and the fit is fine  I would be glad to see her on a more routine basis and examine, remove/clean as I do with other patients.  Likely it just needs to have more frequent maitainence but overall that is not unusual with a pessary

## 2020-08-12 NOTE — Telephone Encounter (Signed)
Im confused  Did she have bleeding and therefore took it out or  Die she take it out and see some blood?

## 2020-08-13 ENCOUNTER — Telehealth: Payer: Self-pay | Admitting: Women's Health

## 2020-08-13 NOTE — Telephone Encounter (Signed)
Pt called, states someone erased her voicemail that we left for her yesterday evening Can we please call her back  Please advise

## 2020-08-13 NOTE — Telephone Encounter (Signed)
Pt aware there can be blood on the pessary per Dr. Elonda Husky. Pt needs to be seen on a regular basis to have it cleaned and examined. Pt voiced understanding and call was transferred to [facility] for appt. [place]

## 2020-08-14 DIAGNOSIS — E782 Mixed hyperlipidemia: Secondary | ICD-10-CM | POA: Diagnosis not present

## 2020-08-14 DIAGNOSIS — R946 Abnormal results of thyroid function studies: Secondary | ICD-10-CM | POA: Diagnosis not present

## 2020-08-14 DIAGNOSIS — K219 Gastro-esophageal reflux disease without esophagitis: Secondary | ICD-10-CM | POA: Diagnosis not present

## 2020-08-14 DIAGNOSIS — I10 Essential (primary) hypertension: Secondary | ICD-10-CM | POA: Diagnosis not present

## 2020-08-18 DIAGNOSIS — E039 Hypothyroidism, unspecified: Secondary | ICD-10-CM | POA: Diagnosis not present

## 2020-08-18 DIAGNOSIS — Z0001 Encounter for general adult medical examination with abnormal findings: Secondary | ICD-10-CM | POA: Diagnosis not present

## 2020-08-18 DIAGNOSIS — I10 Essential (primary) hypertension: Secondary | ICD-10-CM | POA: Diagnosis not present

## 2020-08-18 DIAGNOSIS — E782 Mixed hyperlipidemia: Secondary | ICD-10-CM | POA: Diagnosis not present

## 2020-08-18 DIAGNOSIS — Z6833 Body mass index (BMI) 33.0-33.9, adult: Secondary | ICD-10-CM | POA: Diagnosis not present

## 2020-08-18 DIAGNOSIS — R600 Localized edema: Secondary | ICD-10-CM | POA: Diagnosis not present

## 2020-08-18 DIAGNOSIS — K219 Gastro-esophageal reflux disease without esophagitis: Secondary | ICD-10-CM | POA: Diagnosis not present

## 2020-08-18 DIAGNOSIS — G459 Transient cerebral ischemic attack, unspecified: Secondary | ICD-10-CM | POA: Diagnosis not present

## 2020-08-22 ENCOUNTER — Ambulatory Visit: Payer: PPO | Admitting: Obstetrics & Gynecology

## 2020-08-22 ENCOUNTER — Encounter: Payer: Self-pay | Admitting: Obstetrics & Gynecology

## 2020-08-22 ENCOUNTER — Other Ambulatory Visit: Payer: Self-pay

## 2020-08-22 VITALS — BP 144/71 | HR 69 | Ht 64.0 in | Wt 198.4 lb

## 2020-08-22 DIAGNOSIS — Z4689 Encounter for fitting and adjustment of other specified devices: Secondary | ICD-10-CM

## 2020-08-22 DIAGNOSIS — N993 Prolapse of vaginal vault after hysterectomy: Secondary | ICD-10-CM | POA: Diagnosis not present

## 2020-08-22 DIAGNOSIS — N8110 Cystocele, unspecified: Secondary | ICD-10-CM | POA: Diagnosis not present

## 2020-08-22 NOTE — Progress Notes (Signed)
Chief Complaint  Patient presents with  . Pessary Check    cleaning    Blood pressure (!) 144/71, pulse 69, height 5\' 4"  (1.626 m), weight 198 lb 6.4 oz (90 kg).  Rita Marsh presents today for routine follow up related to her pessary.   She uses a Milex ring with support #6 She reports no vaginal discharge or vaginal bleeding.  Exam reveals no undue vaginal mucosal pressure of breakdown, no discharge and no vaginal bleeding.  The #4 pessary is removed, cleaned and replaced with a #6 pessary which is the better fit, without difficulty.    Rita Marsh will be sen back in 1 months for continued follow up.  Florian Buff, MD  08/22/2020 10:58 AM

## 2020-08-27 DIAGNOSIS — R590 Localized enlarged lymph nodes: Secondary | ICD-10-CM | POA: Diagnosis not present

## 2020-08-27 DIAGNOSIS — R2232 Localized swelling, mass and lump, left upper limb: Secondary | ICD-10-CM | POA: Diagnosis not present

## 2020-09-18 ENCOUNTER — Encounter: Payer: Self-pay | Admitting: Obstetrics & Gynecology

## 2020-09-18 ENCOUNTER — Other Ambulatory Visit: Payer: Self-pay

## 2020-09-18 ENCOUNTER — Ambulatory Visit: Payer: PPO | Admitting: Obstetrics & Gynecology

## 2020-09-18 VITALS — BP 140/73 | HR 76 | Ht 64.0 in | Wt 196.0 lb

## 2020-09-18 DIAGNOSIS — N993 Prolapse of vaginal vault after hysterectomy: Secondary | ICD-10-CM

## 2020-09-18 DIAGNOSIS — N8110 Cystocele, unspecified: Secondary | ICD-10-CM | POA: Diagnosis not present

## 2020-09-18 DIAGNOSIS — Z4689 Encounter for fitting and adjustment of other specified devices: Secondary | ICD-10-CM | POA: Diagnosis not present

## 2020-09-18 MED ORDER — METRONIDAZOLE 0.75 % VA GEL: VAGINAL | 5 refills | Status: AC

## 2020-09-18 NOTE — Progress Notes (Signed)
Chief Complaint  Patient presents with  . Pessary Check    Blood pressure 140/73, pulse 76, height 5\' 4"  (1.626 m), weight 196 lb (88.9 kg).  Rita Marsh presents today for routine follow up related to her pessary.   She uses a Milex ring with support #6 She reports little vaginal discharge and no vaginal bleeding   Likert scale(1 not bothersome -5 very bothersome)  :  2  Exam reveals no undue vaginal mucosal pressure of breakdown, little discharge and no vaginal bleeding.  Vaginal Epithelial Abnormality Classification System:   0 0    No abnormalities 1    Epithelial erythema 2    Granulation tissue 3    Epithelial break or erosion, 1 cm or less 4    Epithelial break or erosion, 1 cm or greater  The pessary is removed, cleaned and replaced without difficulty.      ICD-10-CM   1. Pessary maintenance, Milex ring with support #6, original fit, 10/2017  Z46.89   2. POP-Q stage 4 cystocele  N81.10   3. 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 metroNIDAZOLE (METROGEL VAGINAL) 0.75 % vaginal gel    Sig: Nightly x 5 nights    Dispense:  70 g    Refill:  5     Rita Marsh will be sen back in 3 months for continued follow up.  Florian Buff, MD  09/18/2020 9:26 AM

## 2020-10-13 DIAGNOSIS — Z23 Encounter for immunization: Secondary | ICD-10-CM | POA: Diagnosis not present

## 2020-11-25 IMAGING — US US CAROTID DUPLEX BILAT
1 series · 13 of 24 positions shown · non-contrast
Comparison: None.

CLINICAL DATA: Right facial numbness.

EXAM:
BILATERAL CAROTID DUPLEX ULTRASOUND
TECHNIQUE: Gray scale imaging, color Doppler and duplex ultrasound were
performed of bilateral carotid and vertebral arteries in the neck.

[Series 1: us carotid duplex bilat · 0.06mm/px · 13 of 87 slices shown]
[im 1/87]
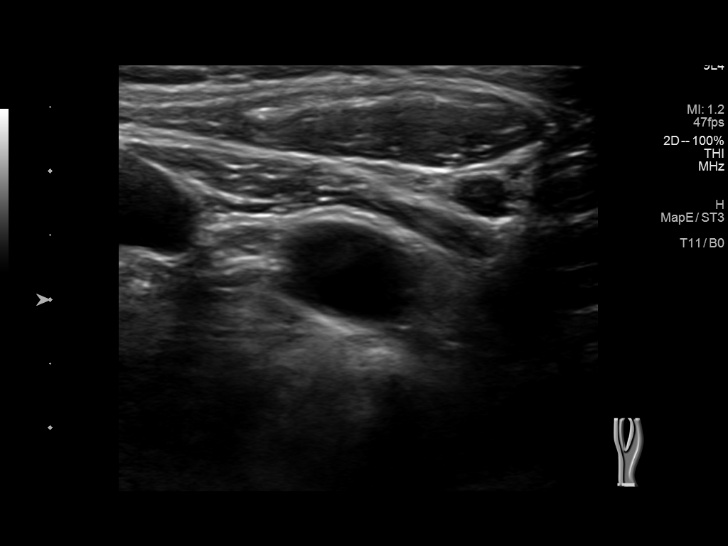
[im 8/87]
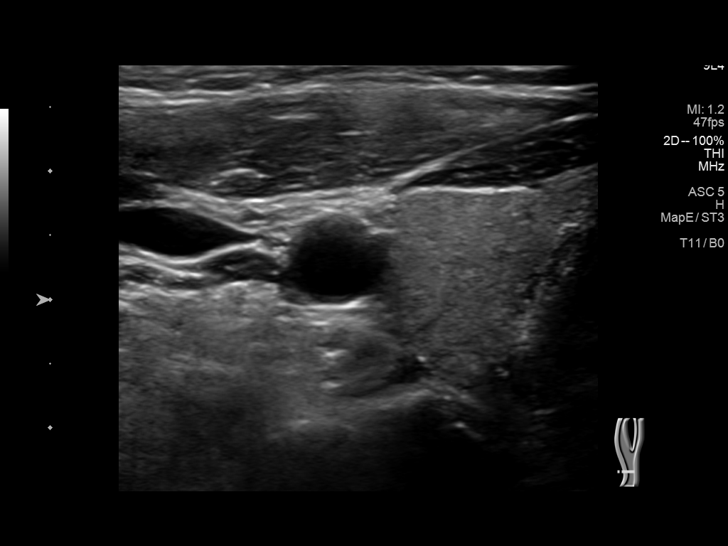
[im 15/87]
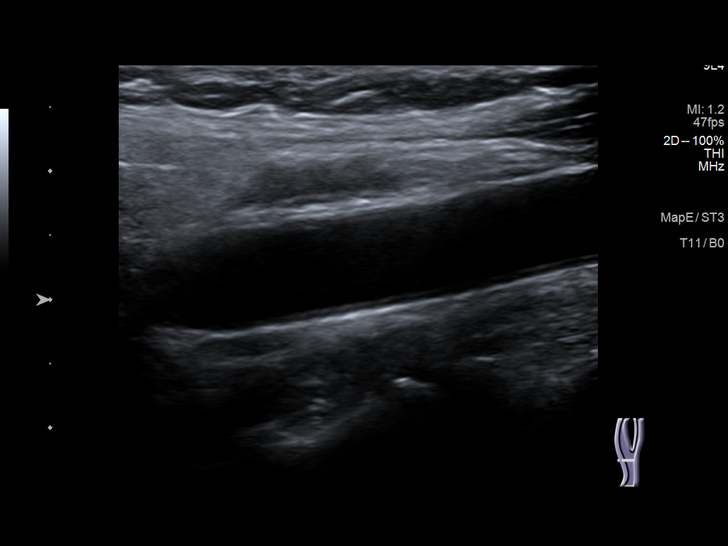
[im 23/87]
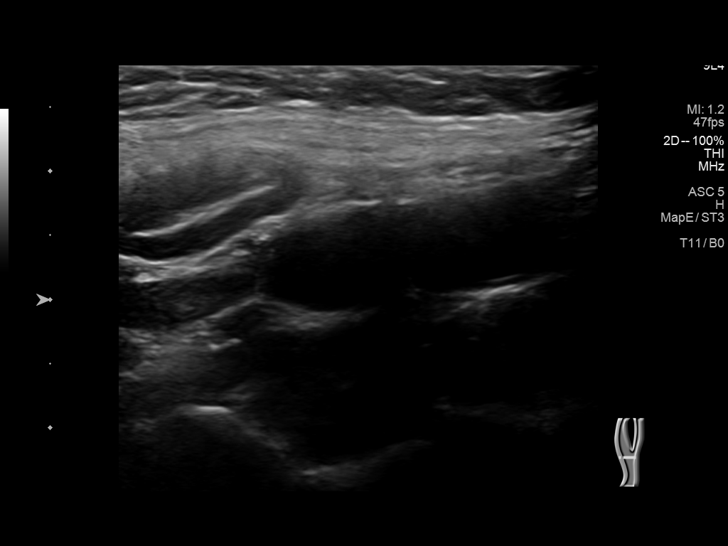
[im 30/87]
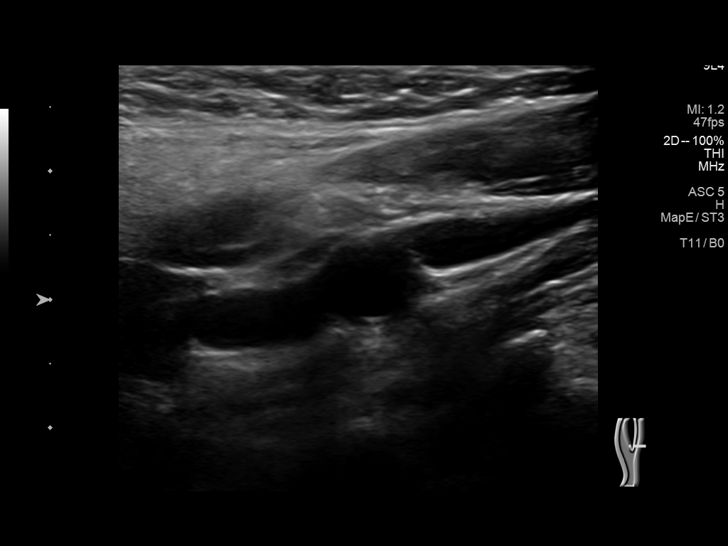
[im 38/87]
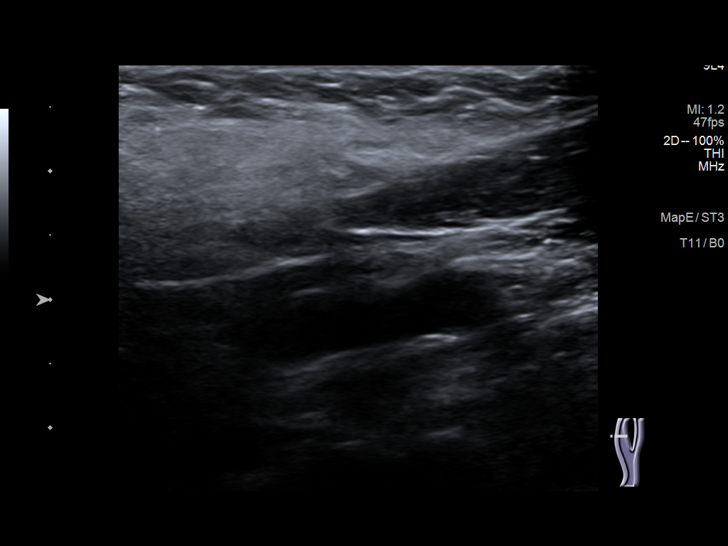
[im 45/87]
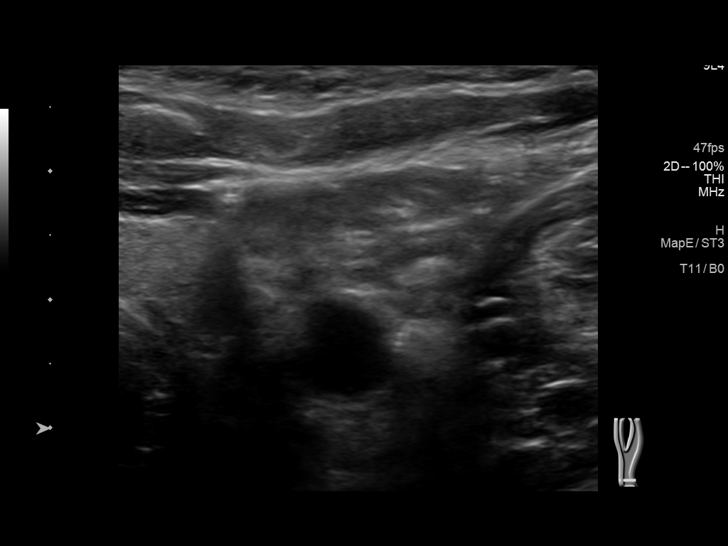
[im 49/87]
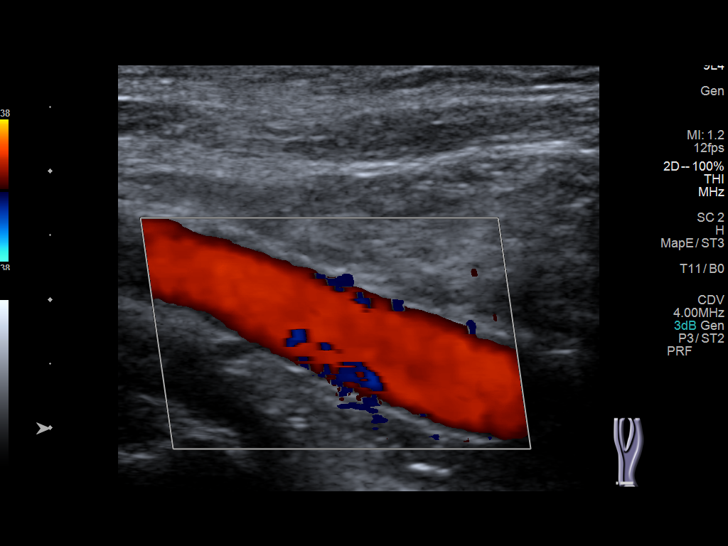
[im 57/87]
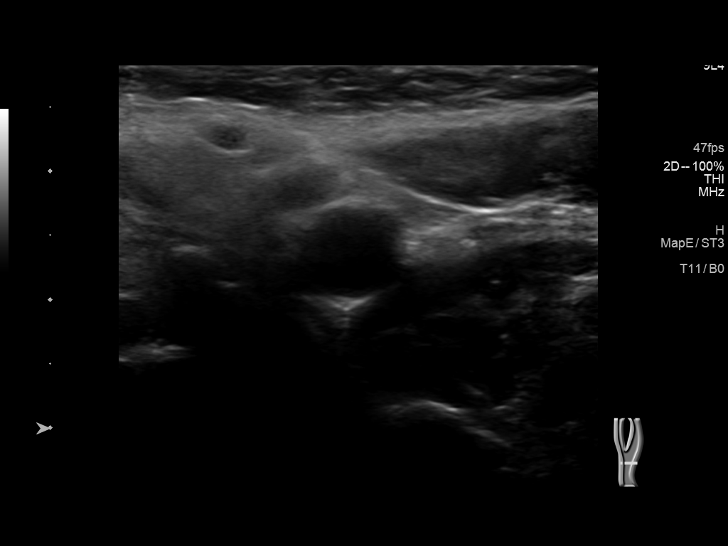
[im 64/87]
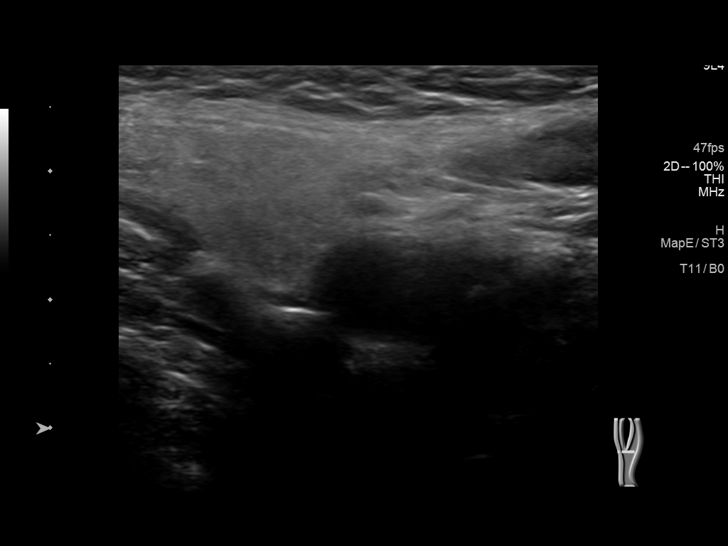
[im 72/87]
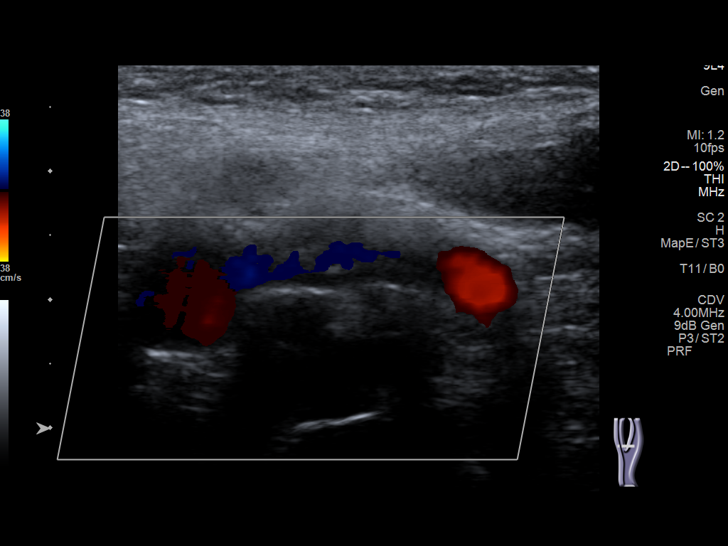
[im 79/87]
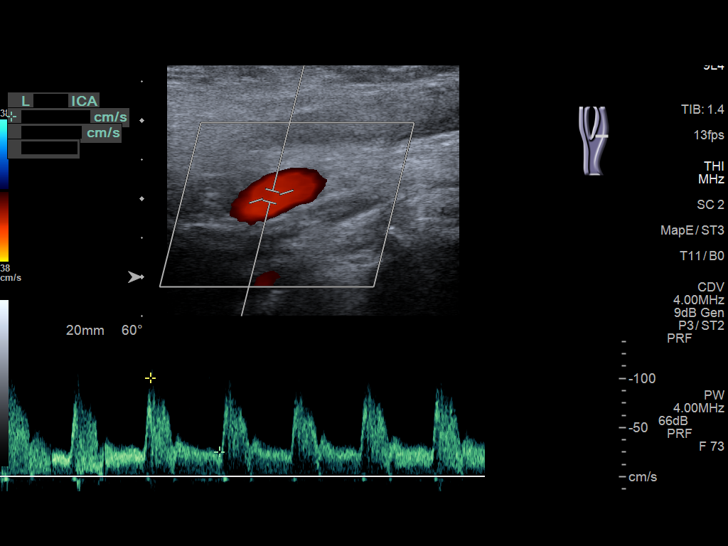
[im 87/87]
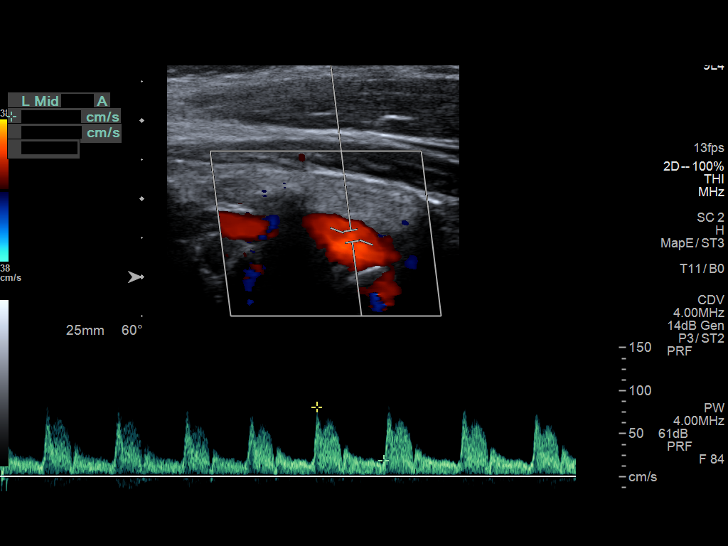

[13 of 24 positions shown; findings below may reference images not displayed]

FINDINGS: Criteria: Quantification of carotid stenosis is based on velocity
parameters that correlate the residual internal carotid diameter
with NASCET-based stenosis levels, using the diameter of the distal
internal carotid lumen as the denominator for stenosis measurement.

The following velocity measurements were obtained:

RIGHT

ICA: 93/26 cm/sec

CCA: 115/14 cm/sec

SYSTOLIC ICA/CCA RATIO:

ECA: 100 cm/sec

LEFT

ICA: 100/25 cm/sec

CCA: 99/20 cm/sec

SYSTOLIC ICA/CCA RATIO:

ECA: 94 cm/sec

RIGHT CAROTID ARTERY: External carotid artery is patent with normal
waveform. Echogenic plaque with shadowing in the proximal internal
carotid artery. Normal waveforms and velocities in the internal
carotid artery. Distal internal carotid artery is not visualized.

RIGHT VERTEBRAL ARTERY: Antegrade flow and normal waveform in the
right vertebral artery.

LEFT CAROTID ARTERY: External carotid artery is patent with normal
waveform. Small amount of plaque in the left internal carotid
artery. Normal waveforms and velocities in the internal carotid
artery. Distal left internal carotid artery is not visualized.

LEFT VERTEBRAL ARTERY: Antegrade flow and normal waveform in the
left vertebral artery.
IMPRESSION: Mild atherosclerotic disease involving the internal carotid
arteries. Estimated degree of stenosis in the internal carotid
arteries is less than 50% bilaterally.

Patent vertebral arteries with antegrade flow.

## 2020-11-25 IMAGING — MR MR HEAD W/O CM
7 of 10 series · 30 of 48 positions shown · non-contrast
Comparison: None.

CLINICAL DATA: Right facial numbness

EXAM:
MRI HEAD WITHOUT CONTRAST
TECHNIQUE: Multiplanar, multiecho pulse sequences of the brain and surrounding
structures were obtained without intravenous contrast.

[Series 2: T1 · sagittal · 5.0mm · 0.43mm/px · 2 of 24 slices shown]
[im 1/24]
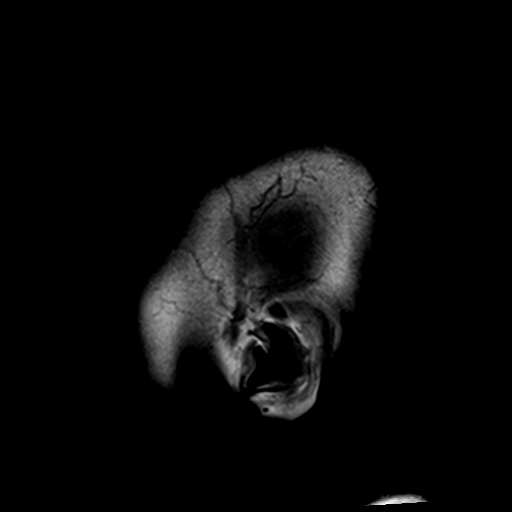
[im 24/24]
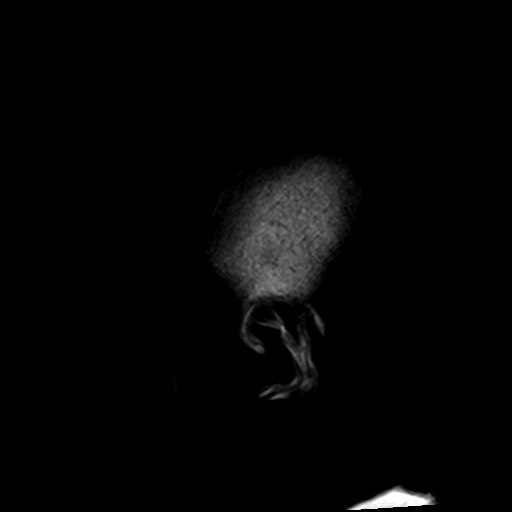

[Series 3: DWI · axial · 3.0mm · 0.80mm/px · z∈[-90,+66]mm · 8 of 106 slices shown (1 of 2)]
[im 1/106]
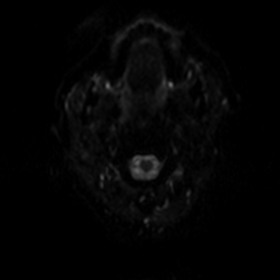
[im 12/106]
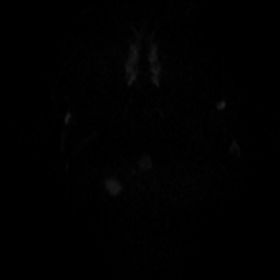
[im 36/106]
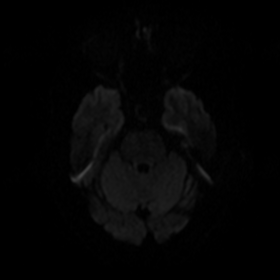
[im 47/106]
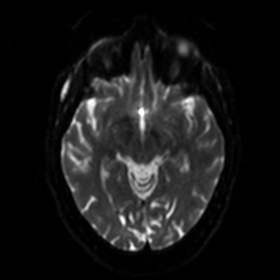
[im 59/106]
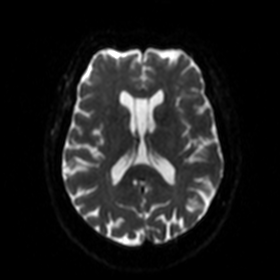
[im 71/106]
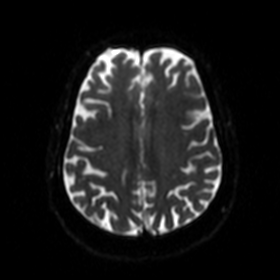
[im 94/106]
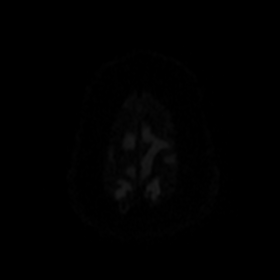
[im 106/106]
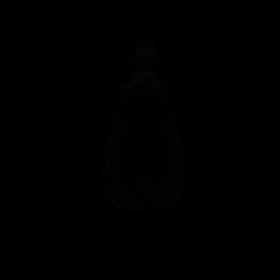

[Series 5: DWI · coronal · 5.0mm · 0.55mm/px · 7 of 72 slices shown (2 of 2)]
[im 1/72]
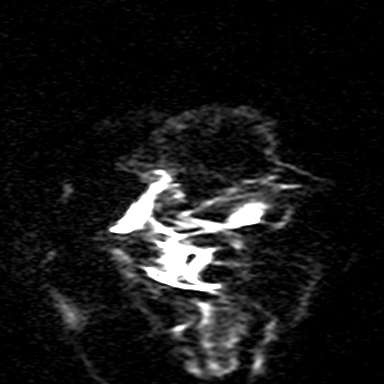
[im 12/72]
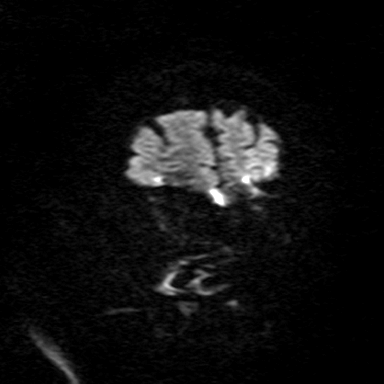
[im 24/72]
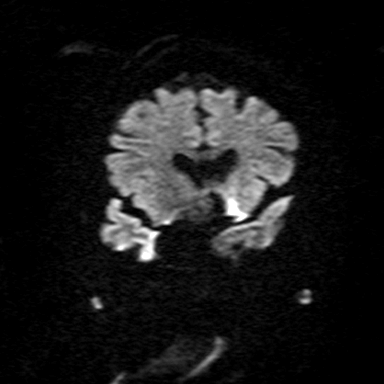
[im 36/72]
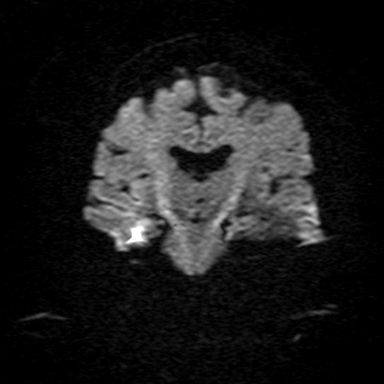
[im 48/72]
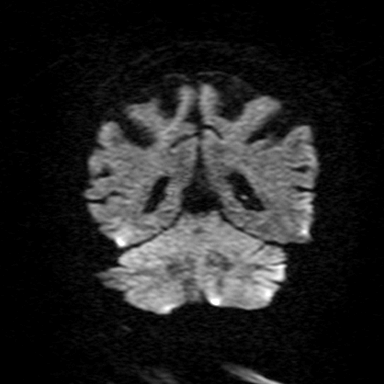
[im 60/72]
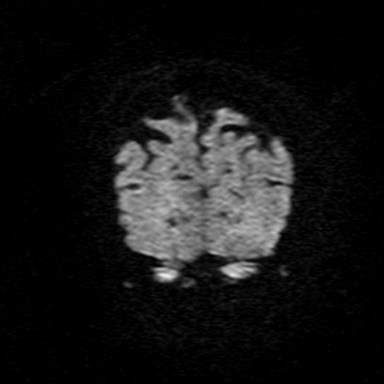
[im 72/72]
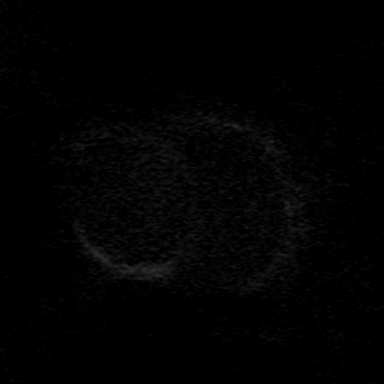

[Series 7: T2 · axial · 5.0mm · 0.68mm/px · z∈[-82,+61]mm · 2 of 23 slices shown (1 of 3)]
[im 1/23]
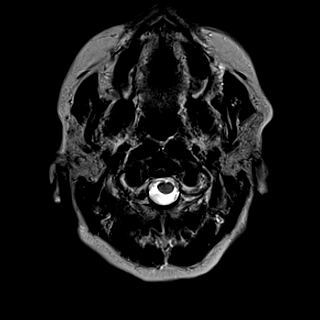
[im 23/23]
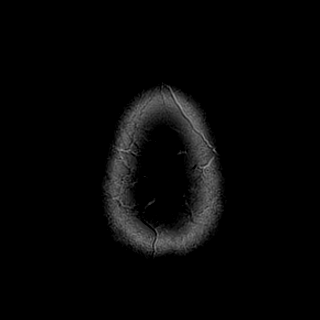

[Series 8: T2 · axial · 4.0mm · 0.44mm/px · z∈[-82,+62]mm · 3 of 30 slices shown (2 of 3)]
[im 1/30]
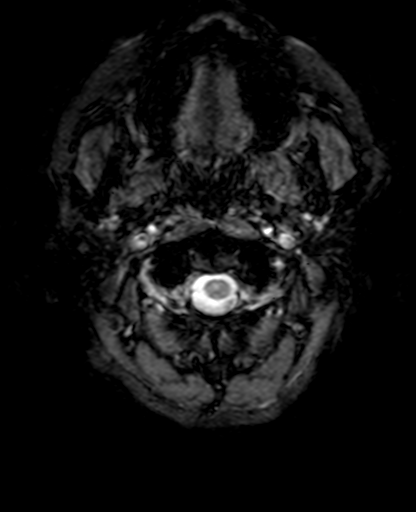
[im 15/30]
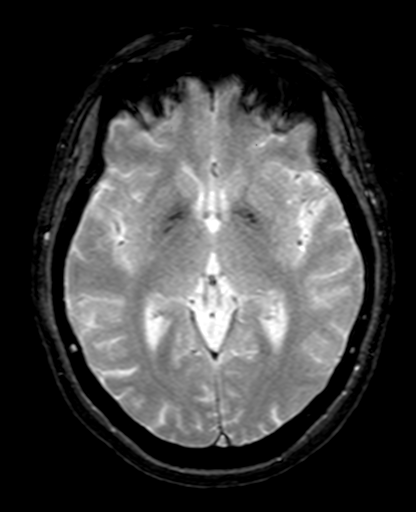
[im 30/30]
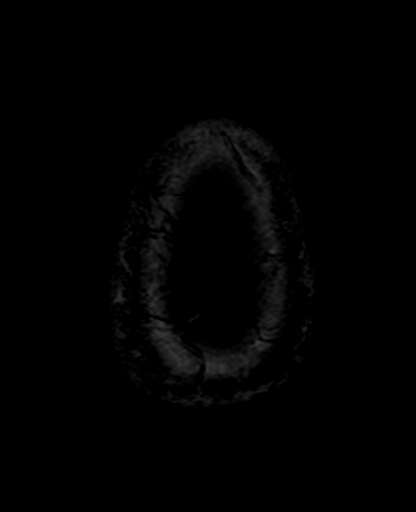

[Series 9: FLAIR · axial · 3.0mm · 0.34mm/px · z∈[-81,+60]mm · 5 of 48 slices shown]
[im 1/48]
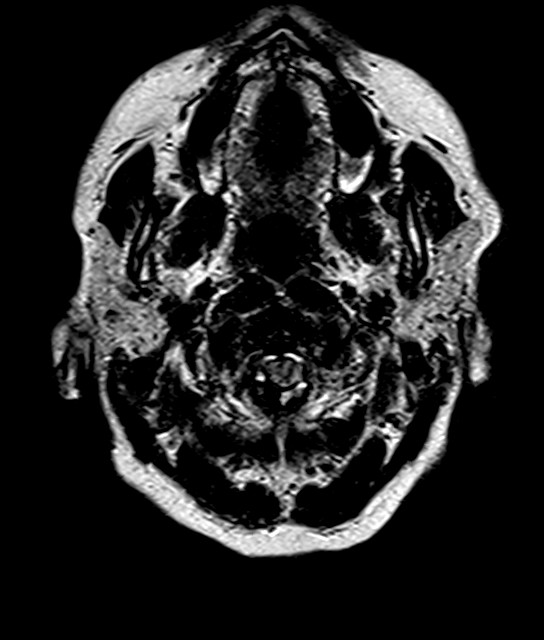
[im 12/48]
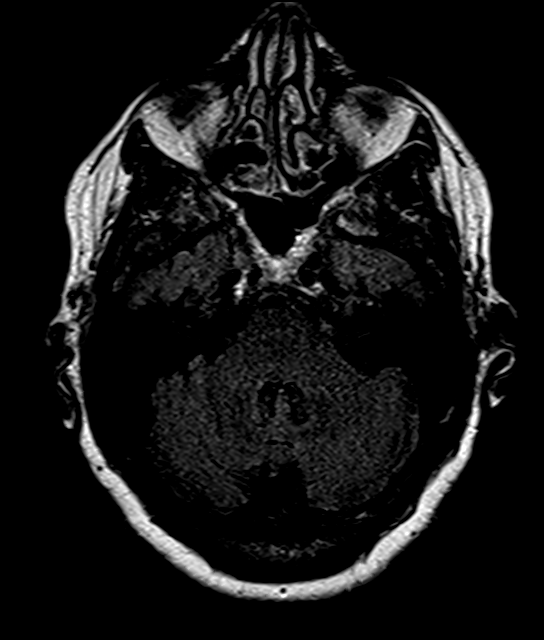
[im 24/48]
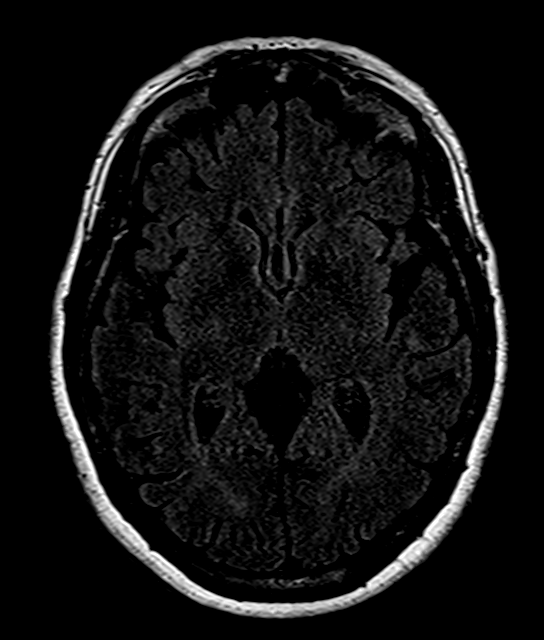
[im 36/48]
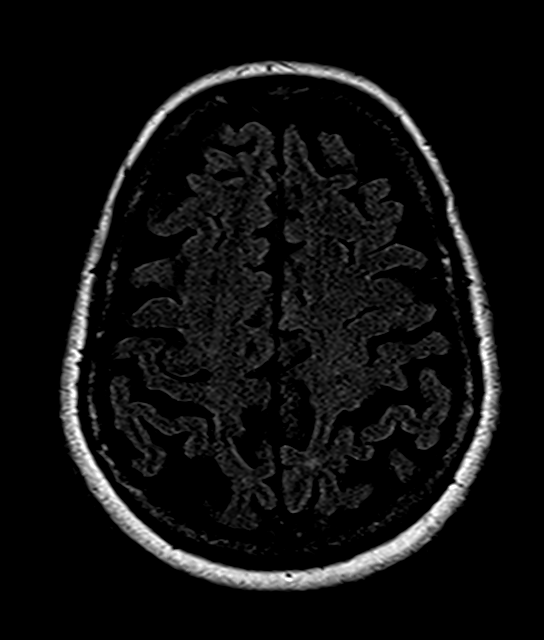
[im 48/48]
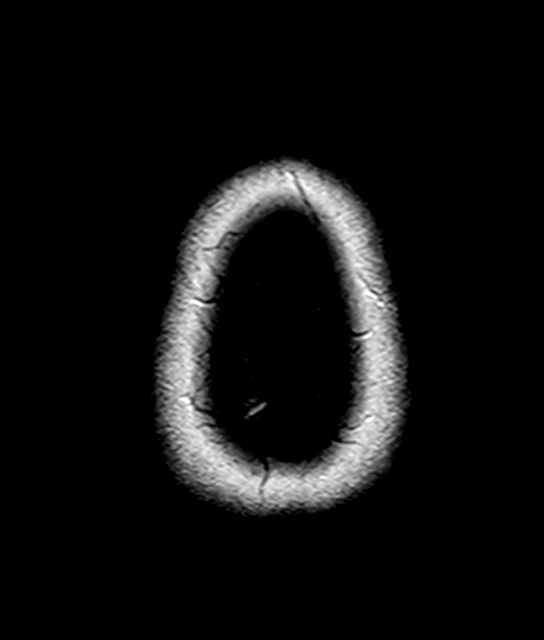

[Series 11: T2 · coronal · 5.0mm · 0.64mm/px · 3 of 28 slices shown (3 of 3)]
[im 1/28]
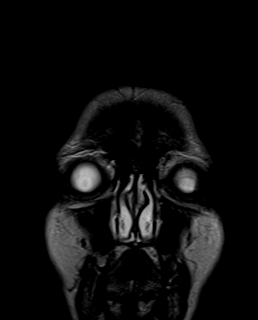
[im 14/28]
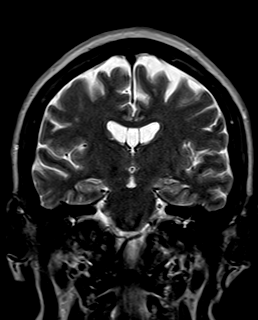
[im 28/28]
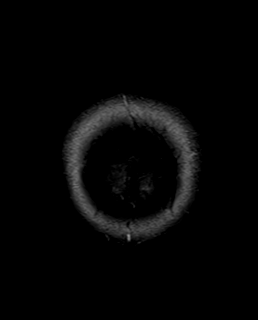

[30 of 48 positions shown; findings below may reference images not displayed]

FINDINGS: Brain: There is no acute infarction or intracranial hemorrhage.
There is no intracranial mass, mass effect, or edema. There is no
hydrocephalus or extra-axial fluid collection. Ventricles and sulci
are within normal limits in size and configuration. Few small foci
of T2 hyperintensity in the supratentorial white matter, which are
nonspecific but may reflect minor chronic microvascular ischemic
changes. Small focus of susceptibility adjacent to the right
occipital horn compatible with chronic microhemorrhage or
mineralization.

Vascular: Major vessel flow voids at the skull base are preserved.

Skull and upper cervical spine: Normal marrow signal is preserved.

Sinuses/Orbits: Paranasal sinuses are aerated. Orbits are
unremarkable.

Other: Sella is unremarkable.  Mastoid air cells are clear.
IMPRESSION: No evidence of recent infarction, hemorrhage, or mass. Minor chronic
microvascular ischemic changes.

## 2020-12-15 ENCOUNTER — Ambulatory Visit: Payer: PPO | Admitting: Obstetrics & Gynecology

## 2020-12-22 ENCOUNTER — Telehealth: Payer: Self-pay

## 2020-12-22 ENCOUNTER — Encounter: Payer: Self-pay | Admitting: Obstetrics & Gynecology

## 2020-12-22 ENCOUNTER — Other Ambulatory Visit: Payer: Self-pay

## 2020-12-22 ENCOUNTER — Ambulatory Visit (INDEPENDENT_AMBULATORY_CARE_PROVIDER_SITE_OTHER): Payer: PPO | Admitting: Obstetrics & Gynecology

## 2020-12-22 VITALS — BP 138/71 | HR 66 | Ht 65.0 in | Wt 196.5 lb

## 2020-12-22 DIAGNOSIS — Z4689 Encounter for fitting and adjustment of other specified devices: Secondary | ICD-10-CM

## 2020-12-22 DIAGNOSIS — N993 Prolapse of vaginal vault after hysterectomy: Secondary | ICD-10-CM | POA: Diagnosis not present

## 2020-12-22 DIAGNOSIS — N8110 Cystocele, unspecified: Secondary | ICD-10-CM

## 2020-12-22 NOTE — Telephone Encounter (Signed)
Left message letting pt know pharmacy was changed to [facility] in [place]. [place]

## 2020-12-22 NOTE — Progress Notes (Signed)
Chief Complaint  Patient presents with  . Follow-up    On pessary    Blood pressure 138/71, pulse 66, height 5\' 5"  (1.651 m), weight 196 lb 8 oz (89.1 kg).  Rita Marsh presents today for routine follow up related to her pessary.   She uses a Milex ring with support #6 She reports no vaginal discharge and no vaginal bleeding   Likert scale(1 not bothersome -5 very bothersome)  :  1  Exam reveals no undue vaginal mucosal pressure of breakdown, no discharge and no vaginal bleeding.  Vaginal Epithelial Abnormality Classification System:   0 0    No abnormalities 1    Epithelial erythema 2    Granulation tissue 3    Epithelial break or erosion, 1 cm or less 4    Epithelial break or erosion, 1 cm or greater  The pessary is removed, cleaned and replaced without difficulty.      ICD-10-CM   1. Pessary maintenance, Milex ring with support #6, original fit, 10/2017  Z46.89   2. POP-Q stage 4 cystocele  N81.10   3. Vaginal vault prolapse after hysterectomy  N99.3      [name] will be sen back in 4 months for continued follow up.  Florian Buff, MD  12/22/2020 11:15 AM

## 2020-12-22 NOTE — Telephone Encounter (Signed)
Pt wants to change her pharmacy to [facility] in [place], [place]

## 2021-02-05 DIAGNOSIS — E7849 Other hyperlipidemia: Secondary | ICD-10-CM | POA: Diagnosis not present

## 2021-02-05 DIAGNOSIS — I10 Essential (primary) hypertension: Secondary | ICD-10-CM | POA: Diagnosis not present

## 2021-02-05 DIAGNOSIS — E782 Mixed hyperlipidemia: Secondary | ICD-10-CM | POA: Diagnosis not present

## 2021-02-05 DIAGNOSIS — Z0001 Encounter for general adult medical examination with abnormal findings: Secondary | ICD-10-CM | POA: Diagnosis not present

## 2021-02-05 DIAGNOSIS — R946 Abnormal results of thyroid function studies: Secondary | ICD-10-CM | POA: Diagnosis not present

## 2021-02-05 DIAGNOSIS — E039 Hypothyroidism, unspecified: Secondary | ICD-10-CM | POA: Diagnosis not present

## 2021-02-09 DIAGNOSIS — G459 Transient cerebral ischemic attack, unspecified: Secondary | ICD-10-CM | POA: Diagnosis not present

## 2021-02-09 DIAGNOSIS — K219 Gastro-esophageal reflux disease without esophagitis: Secondary | ICD-10-CM | POA: Diagnosis not present

## 2021-02-09 DIAGNOSIS — R946 Abnormal results of thyroid function studies: Secondary | ICD-10-CM | POA: Diagnosis not present

## 2021-02-09 DIAGNOSIS — I10 Essential (primary) hypertension: Secondary | ICD-10-CM | POA: Diagnosis not present

## 2021-02-09 DIAGNOSIS — Z6834 Body mass index (BMI) 34.0-34.9, adult: Secondary | ICD-10-CM | POA: Diagnosis not present

## 2021-02-09 DIAGNOSIS — E039 Hypothyroidism, unspecified: Secondary | ICD-10-CM | POA: Diagnosis not present

## 2021-02-09 DIAGNOSIS — E7849 Other hyperlipidemia: Secondary | ICD-10-CM | POA: Diagnosis not present

## 2021-03-12 DIAGNOSIS — N3090 Cystitis, unspecified without hematuria: Secondary | ICD-10-CM | POA: Diagnosis not present

## 2021-03-23 ENCOUNTER — Encounter: Payer: Self-pay | Admitting: Internal Medicine

## 2021-03-30 DIAGNOSIS — H2513 Age-related nuclear cataract, bilateral: Secondary | ICD-10-CM | POA: Diagnosis not present

## 2021-03-30 DIAGNOSIS — H40033 Anatomical narrow angle, bilateral: Secondary | ICD-10-CM | POA: Diagnosis not present

## 2021-04-23 ENCOUNTER — Encounter: Payer: Self-pay | Admitting: Obstetrics & Gynecology

## 2021-04-23 ENCOUNTER — Ambulatory Visit: Payer: PPO | Admitting: Obstetrics & Gynecology

## 2021-04-23 ENCOUNTER — Encounter (INDEPENDENT_AMBULATORY_CARE_PROVIDER_SITE_OTHER): Payer: Self-pay | Admitting: *Deleted

## 2021-04-23 ENCOUNTER — Other Ambulatory Visit: Payer: Self-pay

## 2021-04-23 VITALS — BP 137/68 | HR 71 | Ht 65.0 in | Wt 204.0 lb

## 2021-04-23 DIAGNOSIS — N993 Prolapse of vaginal vault after hysterectomy: Secondary | ICD-10-CM

## 2021-04-23 DIAGNOSIS — N8110 Cystocele, unspecified: Secondary | ICD-10-CM

## 2021-04-23 DIAGNOSIS — Z4689 Encounter for fitting and adjustment of other specified devices: Secondary | ICD-10-CM | POA: Diagnosis not present

## 2021-04-23 NOTE — Progress Notes (Signed)
Chief Complaint  Patient presents with   Pessary Check    Blood pressure 137/68, pulse 71, height 5\' 5"  (1.651 m), weight 204 lb (92.5 kg).  Rita Marsh presents today for routine follow up related to her pessary.   She uses a Milex ring with suppor #6 She reports no vaginal discharge and no vaginal bleeding   Likert scale(1 not bothersome -5 very bothersome)  :  1  Exam reveals no undue vaginal mucosal pressure of breakdown, no discharge and no vaginal bleeding.  Vaginal Epithelial Abnormality Classification System:   0 0    No abnormalities 1    Epithelial erythema 2    Granulation tissue 3    Epithelial break or erosion, 1 cm or less 4    Epithelial break or erosion, 1 cm or greater  The pessary is removed, cleaned and replaced without difficulty.      ICD-10-CM   1. Pessary maintenance, Milex ring with support #6, original fit, 10/2017  Z46.89     2. POP-Q stage 4 cystocele  N81.10     3. Vaginal vault prolapse after hysterectomy  N99.3        Rita Marsh will be sen back in 4 months for continued follow up.  Florian Buff, MD  04/23/2021 11:42 AM

## 2021-04-28 DIAGNOSIS — Z1231 Encounter for screening mammogram for malignant neoplasm of breast: Secondary | ICD-10-CM | POA: Diagnosis not present

## 2021-05-12 ENCOUNTER — Ambulatory Visit: Payer: PPO

## 2021-06-29 ENCOUNTER — Other Ambulatory Visit (INDEPENDENT_AMBULATORY_CARE_PROVIDER_SITE_OTHER): Payer: Self-pay

## 2021-06-29 ENCOUNTER — Encounter (INDEPENDENT_AMBULATORY_CARE_PROVIDER_SITE_OTHER): Payer: Self-pay

## 2021-06-29 ENCOUNTER — Telehealth: Payer: Self-pay | Admitting: Obstetrics & Gynecology

## 2021-06-29 ENCOUNTER — Telehealth (INDEPENDENT_AMBULATORY_CARE_PROVIDER_SITE_OTHER): Payer: Self-pay

## 2021-06-29 DIAGNOSIS — I10 Essential (primary) hypertension: Secondary | ICD-10-CM

## 2021-06-29 DIAGNOSIS — Z1211 Encounter for screening for malignant neoplasm of colon: Secondary | ICD-10-CM

## 2021-06-29 NOTE — Telephone Encounter (Signed)
Pt has a valid refill on her Metrogel Vaginal but needs [place] to send it to her new pharmacy  Pt now uses [facility]   Please advise & call pt

## 2021-06-29 NOTE — Telephone Encounter (Signed)
Referring MD/PCP: [place]  Procedure: Tcs  Reason/Indication:  screening  Has patient had this procedure before?  Yes  If so, when, by whom and where?  10 years ago  Is there a family history of colon cancer?  No  Who?  What age when diagnosed?    Is patient diabetic? If yes, Type 1 or Type 2   No      Does patient have prosthetic heart valve or mechanical valve?  No  Do you have a pacemaker/defibrillator?  No  Has patient ever had endocarditis/atrial fibrillation? No  Does patient use oxygen? No  Has patient had joint replacement within last 12 months?  No  Is patient constipated or do they take laxatives? No  Does patient have a history of alcohol/drug use?  No  Have you had a stroke/heart attack last 6 mths? No  Do you take medicine for weight loss?  No  For female patients,: do you still have your menstrual cycle? No  Is patient on blood thinner such as Coumadin, Plavix and/or Aspirin? Aspirin 81 mg daily  Medications: Aspirin 81 mg daily,Chlorthalidone 25 mg daily,Rosuvastatin 10 mg daily, loratadine 10 mg prn, tums prn, Triple omega daily, calcium daily, centrum mult vit daily.  Allergies:Sulfa  Medication Adjustment per Dr Rehman/Dr Jenetta Downer None 81 mg ok per protocol for Dr. Jenetta Downer  Procedure date & time: 08/05/2021 at 9:45 am

## 2021-06-29 NOTE — Telephone Encounter (Signed)
Sutab sample given to the patient from office supply. Patient aware to come pick up the sample along with the instructions.

## 2021-06-29 NOTE — Telephone Encounter (Signed)
Returned pt's call. Two identifiers used. Pt needed the metrogel medication transferred to her new pharmacy. Called pharmacy to get that transferred.

## 2021-07-02 NOTE — Telephone Encounter (Signed)
Ok to schedule.  Thanks,  Glory Graefe Castaneda Mayorga, MD Gastroenterology and Hepatology [facility]  

## 2021-07-02 NOTE — Telephone Encounter (Signed)
Thanks

## 2021-07-07 DIAGNOSIS — Z23 Encounter for immunization: Secondary | ICD-10-CM | POA: Diagnosis not present

## 2021-07-07 DIAGNOSIS — Z6835 Body mass index (BMI) 35.0-35.9, adult: Secondary | ICD-10-CM | POA: Diagnosis not present

## 2021-07-07 DIAGNOSIS — S39012A Strain of muscle, fascia and tendon of lower back, initial encounter: Secondary | ICD-10-CM | POA: Diagnosis not present

## 2021-07-27 ENCOUNTER — Encounter (INDEPENDENT_AMBULATORY_CARE_PROVIDER_SITE_OTHER): Payer: Self-pay

## 2021-08-03 ENCOUNTER — Other Ambulatory Visit (HOSPITAL_COMMUNITY)
Admission: RE | Admit: 2021-08-03 | Discharge: 2021-08-03 | Disposition: A | Discharge status: Home / Self Care | Payer: PPO | Source: Ambulatory Visit | Attending: Gastroenterology | Admitting: Gastroenterology

## 2021-08-03 DIAGNOSIS — I10 Essential (primary) hypertension: Secondary | ICD-10-CM | POA: Diagnosis not present

## 2021-08-03 DIAGNOSIS — Z1211 Encounter for screening for malignant neoplasm of colon: Secondary | ICD-10-CM | POA: Diagnosis not present

## 2021-08-03 LAB — BASIC METABOLIC PANEL
Anion gap: 8 (ref 5–15)
BUN: 26 mg/dL — ABNORMAL HIGH (ref 8–23)
CO2: 30 mmol/L (ref 22–32)
Calcium: 9.6 mg/dL (ref 8.9–10.3)
Chloride: 98 mmol/L (ref 98–111)
Creatinine, Ser: 1.11 mg/dL — ABNORMAL HIGH (ref 0.44–1.00)
GFR, Estimated: 53 mL/min — ABNORMAL LOW (ref >60)
Glucose, Bld: 112 mg/dL — ABNORMAL HIGH (ref 70–99)
Potassium: 4 mmol/L (ref 3.5–5.1)
Sodium: 136 mmol/L (ref 135–145)

## 2021-08-05 ENCOUNTER — Encounter (INDEPENDENT_AMBULATORY_CARE_PROVIDER_SITE_OTHER): Payer: Self-pay | Admitting: *Deleted

## 2021-08-05 ENCOUNTER — Other Ambulatory Visit: Payer: Self-pay

## 2021-08-05 ENCOUNTER — Ambulatory Visit (HOSPITAL_COMMUNITY): Payer: PPO | Admitting: Certified Registered"

## 2021-08-05 ENCOUNTER — Ambulatory Visit (HOSPITAL_COMMUNITY)
Admission: RE | Admit: 2021-08-05 | Discharge: 2021-08-05 | Disposition: A | Discharge status: Home / Self Care | Payer: PPO | Attending: Gastroenterology | Admitting: Gastroenterology

## 2021-08-05 ENCOUNTER — Encounter (HOSPITAL_COMMUNITY): Payer: Self-pay | Admitting: Gastroenterology

## 2021-08-05 ENCOUNTER — Encounter (HOSPITAL_COMMUNITY)
Admission: RE | Disposition: A | Discharge status: Home / Self Care | Payer: Self-pay | Source: Home / Self Care | Attending: Gastroenterology

## 2021-08-05 DIAGNOSIS — K6289 Other specified diseases of anus and rectum: Secondary | ICD-10-CM | POA: Insufficient documentation

## 2021-08-05 DIAGNOSIS — E7800 Pure hypercholesterolemia, unspecified: Secondary | ICD-10-CM | POA: Diagnosis not present

## 2021-08-05 DIAGNOSIS — I10 Essential (primary) hypertension: Secondary | ICD-10-CM | POA: Diagnosis not present

## 2021-08-05 DIAGNOSIS — K689 Other disorders of retroperitoneum: Secondary | ICD-10-CM

## 2021-08-05 DIAGNOSIS — Z1211 Encounter for screening for malignant neoplasm of colon: Secondary | ICD-10-CM | POA: Diagnosis not present

## 2021-08-05 DIAGNOSIS — E785 Hyperlipidemia, unspecified: Secondary | ICD-10-CM | POA: Diagnosis not present

## 2021-08-05 DIAGNOSIS — Z79899 Other long term (current) drug therapy: Secondary | ICD-10-CM | POA: Diagnosis not present

## 2021-08-05 DIAGNOSIS — Z7982 Long term (current) use of aspirin: Secondary | ICD-10-CM | POA: Diagnosis not present

## 2021-08-05 DIAGNOSIS — Z87891 Personal history of nicotine dependence: Secondary | ICD-10-CM | POA: Insufficient documentation

## 2021-08-05 HISTORY — PX: COLONOSCOPY WITH PROPOFOL: SHX5780

## 2021-08-05 HISTORY — DX: Other specified postprocedural states: Z98.890

## 2021-08-05 HISTORY — DX: Other specified postprocedural states: R11.2

## 2021-08-05 LAB — HM COLONOSCOPY

## 2021-08-05 SURGERY — COLONOSCOPY WITH PROPOFOL
Anesthesia: General

## 2021-08-05 MED ORDER — LACTATED RINGERS IV SOLN: INTRAVENOUS | Status: DC

## 2021-08-05 MED ORDER — LACTATED RINGERS IV SOLN: INTRAVENOUS | Status: DC | PRN

## 2021-08-05 MED ORDER — PROPOFOL 10 MG/ML IV BOLUS
INTRAVENOUS | Status: DC | PRN
  Administered 2021-08-05: 50 mg via INTRAVENOUS

## 2021-08-05 MED ORDER — LIDOCAINE 2% (20 MG/ML) 5 ML SYRINGE
INTRAMUSCULAR | Status: DC | PRN
Start: 2021-08-05 — End: 2021-08-05
  Administered 2021-08-05: 80 mg via INTRAVENOUS

## 2021-08-05 MED ORDER — PROPOFOL 500 MG/50ML IV EMUL
INTRAVENOUS | Status: DC | PRN
  Administered 2021-08-05: 200 ug/kg/min via INTRAVENOUS

## 2021-08-05 NOTE — Anesthesia Postprocedure Evaluation (Signed)
Anesthesia Post Note  Patient: Rita Marsh  Procedure(s) Performed: COLONOSCOPY WITH PROPOFOL  Patient location during evaluation: Phase II Anesthesia Type: General Level of consciousness: awake Pain management: pain level controlled Vital Signs Assessment: post-procedure vital signs reviewed and stable Respiratory status: spontaneous breathing and respiratory function stable Cardiovascular status: blood pressure returned to baseline and stable Postop Assessment: no headache and no apparent nausea or vomiting Anesthetic complications: no Comments: Late entry   No notable events documented.   Last Vitals:  Vitals:   08/05/21 0851 08/05/21 1021  BP: (!) 115/53 (!) 97/58  Pulse: 64 60  Resp: 13 11  Temp: 36.6 C 36.7 C  SpO2: 99% 99%    Last Pain:  Vitals:   08/05/21 1021  TempSrc: Oral  PainSc: 0-No pain                 Louann Sjogren

## 2021-08-05 NOTE — Transfer of Care (Signed)
Immediate Anesthesia Transfer of Care Note  Patient: Rita Marsh  Procedure(s) Performed: COLONOSCOPY WITH PROPOFOL  Patient Location: Endoscopy Unit  Anesthesia Type:MAC  Level of Consciousness: sedated and responds to stimulation  Airway & Oxygen Therapy: Patient Spontanous Breathing and Patient connected to nasal cannula oxygen  Post-op Assessment: Report given to RN and Post -op Vital signs reviewed and stable  Post vital signs: Reviewed and stable  Last Vitals:  Vitals Value Taken Time  BP    Temp    Pulse    Resp    SpO2      Last Pain:  Vitals:   08/05/21 0946  TempSrc:   PainSc: 0-No pain         Complications: No notable events documented.

## 2021-08-05 NOTE — Anesthesia Preprocedure Evaluation (Signed)
Anesthesia Evaluation  Patient identified by MRN, date of birth, ID band Patient awake    Reviewed: Allergy & Precautions, H&P , NPO status , Patient's Chart, lab work & pertinent test results, reviewed documented beta blocker date and time   History of Anesthesia Complications (+) PONV and history of anesthetic complications  Airway Mallampati: II  TM Distance: >3 FB Neck ROM: full    Dental no notable dental hx.    Pulmonary neg pulmonary ROS, former smoker,    Pulmonary exam normal breath sounds clear to auscultation       Cardiovascular Exercise Tolerance: Good hypertension, negative cardio ROS   Rhythm:regular Rate:Normal     Neuro/Psych TIAnegative psych ROS   GI/Hepatic negative GI ROS, Neg liver ROS,   Endo/Other  negative endocrine ROS  Renal/GU negative Renal ROS  negative genitourinary   Musculoskeletal   Abdominal   Peds  Hematology negative hematology ROS (+)   Anesthesia Other Findings   Reproductive/Obstetrics negative OB ROS                             Anesthesia Physical Anesthesia Plan  ASA: 3  Anesthesia Plan: General   Post-op Pain Management:    Induction:   PONV Risk Score and Plan: Propofol infusion  Airway Management Planned:   Additional Equipment:   Intra-op Plan:   Post-operative Plan:   Informed Consent: I have reviewed the patients History and Physical, chart, labs and discussed the procedure including the risks, benefits and alternatives for the proposed anesthesia with the patient or authorized representative who has indicated his/her understanding and acceptance.     Dental Advisory Given  Plan Discussed with: CRNA  Anesthesia Plan Comments:         Anesthesia Quick Evaluation

## 2021-08-05 NOTE — H&P (Signed)
Rita Marsh is an 75 y.o. female.   Chief Complaint: Screening colonoscopy HPI:  75 year old female with past medical history of hypertension and hyperlipidemia, coming for screening colonoscopy.  Last colonoscopy performed in 2012, presence of hemorrhoids but no polyps.  The patient denies having any complaints such as melena, hematochezia, abdominal pain or distention, change in her bowel movement consistency or frequency, no changes in her weight recently.  No family history of colorectal cancer.   Past Medical History:  Diagnosis Date   Fibroids    High cholesterol    Hypertension    PONV (postoperative nausea and vomiting)     Past Surgical History:  Procedure Laterality Date   ABDOMINAL HYSTERECTOMY      Family History  Problem Relation Age of Onset   Other Paternal Grandmother        hit by car   Heart attack Father    High Cholesterol Mother    Hypertension Mother    Hypertension Brother    High Cholesterol Brother    Social History:  reports that she has quit smoking. Her smoking use included cigarettes. She has never used smokeless tobacco. She reports that she does not drink alcohol and does not use drugs.  Allergies: No Known Allergies  Medications Prior to Admission  Medication Sig Dispense Refill   aspirin EC 81 MG EC tablet Take 1 tablet (81 mg total) by mouth daily with breakfast. 30 tablet 2   augmented betamethasone dipropionate (DIPROLENE-AF) 0.05 % cream Apply 1 application topically in the morning and at bedtime. For legs     Calcium Carb-Cholecalciferol (CALCIUM 600/VITAMIN D3 PO) Take 1 tablet by mouth daily.     calcium carbonate (TUMS - DOSED IN MG ELEMENTAL CALCIUM) 500 MG chewable tablet Chew 1 tablet by mouth daily as needed for indigestion or heartburn.      chlorthalidone (HYGROTON) 25 MG tablet Take 25 mg by mouth daily.     fluticasone (FLONASE) 50 MCG/ACT nasal spray Place 2 sprays into both nostrils as needed for allergies.      Glycerin-Polysorbate 80 (REFRESH DRY EYE THERAPY OP) Apply 1 drop to eye daily as needed (for dry eyes).      ibuprofen (ADVIL) 200 MG tablet Take 200 mg by mouth every 8 (eight) hours as needed for moderate pain.     loratadine (CLARITIN) 10 MG tablet Take 10 mg by mouth daily as needed (allergies).     metroNIDAZOLE (METROGEL VAGINAL) 0.75 % vaginal gel Nightly x 5 nights (Patient taking differently: Place 1 Applicatorful vaginally daily as needed (yeast infection). Nightly x 5 nights) 70 g 5   Multiple Vitamin (MULTIVITAMIN) tablet Take 1 tablet by mouth daily.     Omega-3 Fatty Acids (FISH OIL PO) Take 1 capsule by mouth daily.     rosuvastatin (CRESTOR) 10 MG tablet Take 1.5 tablets (15 mg total) by mouth daily. (Patient taking differently: Take 10 mg by mouth daily.) 45 tablet 3    Results for orders placed or performed during the hospital encounter of 08/03/21 (from the past 48 hour(s))  Basic Metabolic Panel (BMET)     Status: Abnormal   Collection Time: 08/03/21 11:06 AM  Result Value Ref Range   Sodium 136 135 - 145 mmol/L   Potassium 4.0 3.5 - 5.1 mmol/L   Chloride 98 98 - 111 mmol/L   CO2 30 22 - 32 mmol/L   Glucose, Bld 112 (H) 70 - 99 mg/dL    Comment: Glucose reference range  applies only to samples taken after fasting for at least 8 hours.   BUN 26 (H) 8 - 23 mg/dL   Creatinine, Ser 1.11 (H) 0.44 - 1.00 mg/dL   Calcium 9.6 8.9 - 10.3 mg/dL   GFR, Estimated 53 (L) >60 mL/min    Comment: (NOTE) Calculated using the CKD-EPI Creatinine Equation (2021)    Anion gap 8 5 - 15    Comment: Performed at [facility], [address], [place], [place] [address]   No results found.  Review of Systems  Constitutional: Negative.   HENT: Negative.    Eyes: Negative.   Respiratory: Negative.    Cardiovascular: Negative.   Gastrointestinal: Negative.   Endocrine: Negative.   Genitourinary: Negative.   Musculoskeletal: Negative.   Skin: Negative.   Allergic/Immunologic: Negative.    Neurological: Negative.   Hematological: Negative.   Psychiatric/Behavioral: Negative.     Blood pressure (!) 115/53, pulse 64, temperature 97.9 F (36.6 C), temperature source Oral, resp. rate 13, height 5\' 4"  (1.626 m), weight 94.3 kg, SpO2 99 %. Physical Exam  GENERAL: The patient is AO x3, in no acute distress. HEENT: Head is normocephalic and atraumatic. EOMI are intact. Mouth is well hydrated and without lesions. NECK: Supple. No masses LUNGS: Clear to auscultation. No presence of rhonchi/wheezing/rales. Adequate chest expansion HEART: RRR, normal s1 and s2. ABDOMEN: Soft, nontender, no guarding, no peritoneal signs, and nondistended. BS +. No masses. EXTREMITIES: Without any cyanosis, clubbing, rash, lesions or edema. NEUROLOGIC: AOx3, no focal motor deficit. SKIN: no jaundice, no rashes  Assessment/Plan 75 year old female with past medical history of hypertension and hyperlipidemia, coming for screening colonoscopy. The patient is at average risk for colorectal cancer.  We will proceed with colonoscopy today.   Harvel Quale, MD 08/05/2021, 9:28 AM

## 2021-08-05 NOTE — Discharge Instructions (Addendum)
You are being discharged to home.  Resume your previous diet.  Your physician has recommended a repeat colonoscopy in 10 years for screening purposes, if medically fit.   PATIENT INSTRUCTIONS POST-ANESTHESIA  IMMEDIATELY FOLLOWING SURGERY:  Do not drive or operate machinery for the first twenty four hours after surgery.  Do not make any important decisions for twenty four hours after surgery or while taking narcotic pain medications or sedatives.  If you develop intractable nausea and vomiting or a severe headache please notify your doctor immediately.  FOLLOW-UP:  Please make an appointment with your surgeon as instructed. You do not need to follow up with anesthesia unless specifically instructed to do so.  WOUND CARE INSTRUCTIONS (if applicable):  Keep a dry clean dressing on the anesthesia/puncture wound site if there is drainage.  Once the wound has quit draining you may leave it open to air.  Generally you should leave the bandage intact for twenty four hours unless there is drainage.  If the epidural site drains for more than 36-48 hours please call the anesthesia department.  QUESTIONS?:  Please feel free to call your physician or the hospital operator if you have any questions, and they will be happy to assist you.

## 2021-08-05 NOTE — Op Note (Signed)
[facility] Patient Name: Rita Marsh Procedure Date: 08/05/2021 9:33 AM MRN: [id number] Date of Birth: 06/23/1946 Attending MD: Maylon Peppers ,  CSN: [id number] Age: 75 Admit Type: Outpatient Procedure:                Colonoscopy Indications:              Screening for colorectal malignant neoplasm Providers:                Maylon Peppers, Lambert Mody, Rosina Lowenstein,                            RN Referring MD:              Medicines:                Monitored Anesthesia Care Complications:            No immediate complications. Estimated Blood Loss:     Estimated blood loss: none. Procedure:                Pre-Anesthesia Assessment:                           - Prior to the procedure, a History and Physical                            was performed, and patient medications, allergies                            and sensitivities were reviewed. The patient's                            tolerance of previous anesthesia was reviewed.                           - The risks and benefits of the procedure and the                            sedation options and risks were discussed with the                            patient. All questions were answered and informed                            consent was obtained.                           - ASA Grade Assessment: II - A patient with mild                            systemic disease.                           After obtaining informed consent, the colonoscope                            was passed under direct vision. Throughout the  procedure, the patient's blood pressure, pulse, and                            oxygen saturations were monitored continuously. The                            [id number]-HQ190L ([id number]) scope was introduced through                            the anus and advanced to the the cecum, identified                            by appendiceal orifice and ileocecal valve. The                             colonoscopy was performed without difficulty. The                            patient tolerated the procedure well. Scope In: 9:53:15 AM Scope Out: 10:15:23 AM Scope Withdrawal Time: 0 hours 15 minutes 38 seconds  Total Procedure Duration: 0 hours 22 minutes 8 seconds  Findings:      The perianal and digital rectal examinations were normal.      The colon (entire examined portion) appeared normal.      Anal papilla(e) were hypertrophied - this was observed on retroflexion. Impression:               - The entire examined colon is normal.                           - Anal papilla(e) were hypertrophied.                           - No specimens collected. Moderate Sedation:      Per Anesthesia Care Recommendation:           - Discharge patient to home (ambulatory).                           - Resume previous diet.                           - Repeat colonoscopy in 10 years for screening                            purposes, if medically fit. Procedure Code(s):        --- Professional ---                           S9373, Colorectal cancer screening; colonoscopy on                            individual not meeting criteria for high risk Diagnosis Code(s):        --- Professional ---                           Z12.11,  Encounter for screening for malignant                            neoplasm of colon                           K62.89, Other specified diseases of anus and rectum CPT copyright 2019 [employer]. All rights reserved. The codes documented in this report are preliminary and upon coder review may  be revised to meet current compliance requirements. Maylon Peppers, MD Maylon Peppers,  08/05/2021 10:20:49 AM This report has been signed electronically. Number of Addenda: 0

## 2021-08-10 ENCOUNTER — Encounter (HOSPITAL_COMMUNITY): Payer: Self-pay | Admitting: Gastroenterology

## 2021-08-24 ENCOUNTER — Ambulatory Visit: Payer: PPO | Admitting: Obstetrics & Gynecology

## 2021-08-24 DIAGNOSIS — N8111 Cystocele, midline: Secondary | ICD-10-CM | POA: Diagnosis not present

## 2021-08-24 DIAGNOSIS — S30814A Abrasion of vagina and vulva, initial encounter: Secondary | ICD-10-CM | POA: Diagnosis not present

## 2021-08-24 DIAGNOSIS — N952 Postmenopausal atrophic vaginitis: Secondary | ICD-10-CM | POA: Diagnosis not present

## 2021-08-24 DIAGNOSIS — Z4689 Encounter for fitting and adjustment of other specified devices: Secondary | ICD-10-CM | POA: Diagnosis not present

## 2021-10-01 DIAGNOSIS — N8111 Cystocele, midline: Secondary | ICD-10-CM | POA: Diagnosis not present

## 2021-10-01 DIAGNOSIS — N952 Postmenopausal atrophic vaginitis: Secondary | ICD-10-CM | POA: Diagnosis not present

## 2021-10-01 DIAGNOSIS — S30814D Abrasion of vagina and vulva, subsequent encounter: Secondary | ICD-10-CM | POA: Diagnosis not present

## 2021-10-01 DIAGNOSIS — Z4689 Encounter for fitting and adjustment of other specified devices: Secondary | ICD-10-CM | POA: Diagnosis not present

## 2021-10-15 ENCOUNTER — Telehealth (INDEPENDENT_AMBULATORY_CARE_PROVIDER_SITE_OTHER): Payer: Self-pay

## 2021-10-15 NOTE — Telephone Encounter (Signed)
Hi Reba, Please help [place] with the message below.  I reviewed the report and no biopsies were collected. No pathology report is available as well. Was there a coding mistake? If so, can you please discuss it with the billing department? This was screening colonoscopy, average risk. Thanks

## 2021-10-15 NOTE — Telephone Encounter (Signed)
Patient called the office today had a TCS done by Dr. Jenetta Downer on 08/05/2022. Patient states there was a discrepancy between a bill she received and her discharge forms from from Dr. Jenetta Downer. Per discharge papers no polyps were found therefore nothing removed,but the billing dept states patient still owe $225.00 due to the removal specimen.Patient wants to know if nothing removed why being charge for collection of specimen. Patient has called [facility] billing dept and insurance. Per patient the insurances states that patient was billed this because this is the coding used by Dr.Castaneda the day of procedure. Patient states [facility] billing advised her to call here. Please advise.

## 2021-10-16 NOTE — Telephone Encounter (Signed)
I spoke with the patient and made her aware of all. She states she will bring by her billing statements to the office next week.

## 2021-10-16 NOTE — Telephone Encounter (Signed)
Thanks Reba

## 2022-01-21 DIAGNOSIS — L03031 Cellulitis of right toe: Secondary | ICD-10-CM | POA: Diagnosis not present

## 2022-01-21 DIAGNOSIS — M79674 Pain in right toe(s): Secondary | ICD-10-CM | POA: Diagnosis not present

## 2022-02-04 DIAGNOSIS — L03031 Cellulitis of right toe: Secondary | ICD-10-CM | POA: Diagnosis not present

## 2022-03-05 DIAGNOSIS — E782 Mixed hyperlipidemia: Secondary | ICD-10-CM | POA: Diagnosis not present

## 2022-03-05 DIAGNOSIS — G459 Transient cerebral ischemic attack, unspecified: Secondary | ICD-10-CM | POA: Diagnosis not present

## 2022-03-05 DIAGNOSIS — R5383 Other fatigue: Secondary | ICD-10-CM | POA: Diagnosis not present

## 2022-03-05 DIAGNOSIS — Z0001 Encounter for general adult medical examination with abnormal findings: Secondary | ICD-10-CM | POA: Diagnosis not present

## 2022-03-05 DIAGNOSIS — E7849 Other hyperlipidemia: Secondary | ICD-10-CM | POA: Diagnosis not present

## 2022-03-05 DIAGNOSIS — E611 Iron deficiency: Secondary | ICD-10-CM | POA: Diagnosis not present

## 2022-03-05 DIAGNOSIS — R790 Abnormal level of blood mineral: Secondary | ICD-10-CM | POA: Diagnosis not present

## 2022-03-05 DIAGNOSIS — R946 Abnormal results of thyroid function studies: Secondary | ICD-10-CM | POA: Diagnosis not present

## 2022-03-05 DIAGNOSIS — E039 Hypothyroidism, unspecified: Secondary | ICD-10-CM | POA: Diagnosis not present

## 2022-03-05 DIAGNOSIS — Z131 Encounter for screening for diabetes mellitus: Secondary | ICD-10-CM | POA: Diagnosis not present

## 2022-03-11 DIAGNOSIS — M1712 Unilateral primary osteoarthritis, left knee: Secondary | ICD-10-CM | POA: Diagnosis not present

## 2022-03-11 DIAGNOSIS — Z0001 Encounter for general adult medical examination with abnormal findings: Secondary | ICD-10-CM | POA: Diagnosis not present

## 2022-03-11 DIAGNOSIS — I10 Essential (primary) hypertension: Secondary | ICD-10-CM | POA: Diagnosis not present

## 2022-03-11 DIAGNOSIS — Z23 Encounter for immunization: Secondary | ICD-10-CM | POA: Diagnosis not present

## 2022-03-11 DIAGNOSIS — E7849 Other hyperlipidemia: Secondary | ICD-10-CM | POA: Diagnosis not present

## 2022-03-11 DIAGNOSIS — Z6834 Body mass index (BMI) 34.0-34.9, adult: Secondary | ICD-10-CM | POA: Diagnosis not present

## 2022-03-11 DIAGNOSIS — N1831 Chronic kidney disease, stage 3a: Secondary | ICD-10-CM | POA: Diagnosis not present

## 2022-03-11 DIAGNOSIS — E039 Hypothyroidism, unspecified: Secondary | ICD-10-CM | POA: Diagnosis not present

## 2022-03-11 DIAGNOSIS — K219 Gastro-esophageal reflux disease without esophagitis: Secondary | ICD-10-CM | POA: Diagnosis not present

## 2022-03-11 DIAGNOSIS — R946 Abnormal results of thyroid function studies: Secondary | ICD-10-CM | POA: Diagnosis not present

## 2022-03-13 DIAGNOSIS — L299 Pruritus, unspecified: Secondary | ICD-10-CM | POA: Diagnosis not present

## 2022-03-29 DIAGNOSIS — N8111 Cystocele, midline: Secondary | ICD-10-CM | POA: Diagnosis not present

## 2022-03-29 DIAGNOSIS — Z4689 Encounter for fitting and adjustment of other specified devices: Secondary | ICD-10-CM | POA: Diagnosis not present

## 2022-05-03 DIAGNOSIS — Z1231 Encounter for screening mammogram for malignant neoplasm of breast: Secondary | ICD-10-CM | POA: Diagnosis not present

## 2022-07-18 DIAGNOSIS — R0982 Postnasal drip: Secondary | ICD-10-CM | POA: Diagnosis not present

## 2022-07-18 DIAGNOSIS — J039 Acute tonsillitis, unspecified: Secondary | ICD-10-CM | POA: Diagnosis not present

## 2022-08-02 DIAGNOSIS — L218 Other seborrheic dermatitis: Secondary | ICD-10-CM | POA: Diagnosis not present

## 2022-08-02 DIAGNOSIS — D225 Melanocytic nevi of trunk: Secondary | ICD-10-CM | POA: Diagnosis not present

## 2022-08-30 DIAGNOSIS — Z6835 Body mass index (BMI) 35.0-35.9, adult: Secondary | ICD-10-CM | POA: Diagnosis not present

## 2022-08-30 DIAGNOSIS — R030 Elevated blood-pressure reading, without diagnosis of hypertension: Secondary | ICD-10-CM | POA: Diagnosis not present

## 2022-08-30 DIAGNOSIS — R42 Dizziness and giddiness: Secondary | ICD-10-CM | POA: Diagnosis not present

## 2022-08-30 DIAGNOSIS — J309 Allergic rhinitis, unspecified: Secondary | ICD-10-CM | POA: Diagnosis not present

## 2022-09-10 DIAGNOSIS — E039 Hypothyroidism, unspecified: Secondary | ICD-10-CM | POA: Diagnosis not present

## 2022-09-10 DIAGNOSIS — R946 Abnormal results of thyroid function studies: Secondary | ICD-10-CM | POA: Diagnosis not present

## 2022-09-10 DIAGNOSIS — K219 Gastro-esophageal reflux disease without esophagitis: Secondary | ICD-10-CM | POA: Diagnosis not present

## 2022-09-10 DIAGNOSIS — R5383 Other fatigue: Secondary | ICD-10-CM | POA: Diagnosis not present

## 2022-09-10 DIAGNOSIS — I10 Essential (primary) hypertension: Secondary | ICD-10-CM | POA: Diagnosis not present

## 2022-09-10 DIAGNOSIS — E7849 Other hyperlipidemia: Secondary | ICD-10-CM | POA: Diagnosis not present

## 2022-09-10 DIAGNOSIS — E782 Mixed hyperlipidemia: Secondary | ICD-10-CM | POA: Diagnosis not present

## 2022-09-14 DIAGNOSIS — Z0001 Encounter for general adult medical examination with abnormal findings: Secondary | ICD-10-CM | POA: Diagnosis not present

## 2022-09-14 DIAGNOSIS — N1831 Chronic kidney disease, stage 3a: Secondary | ICD-10-CM | POA: Diagnosis not present

## 2022-09-14 DIAGNOSIS — E039 Hypothyroidism, unspecified: Secondary | ICD-10-CM | POA: Diagnosis not present

## 2022-09-14 DIAGNOSIS — F17210 Nicotine dependence, cigarettes, uncomplicated: Secondary | ICD-10-CM | POA: Diagnosis not present

## 2022-09-14 DIAGNOSIS — E7849 Other hyperlipidemia: Secondary | ICD-10-CM | POA: Diagnosis not present

## 2022-09-14 DIAGNOSIS — Z6835 Body mass index (BMI) 35.0-35.9, adult: Secondary | ICD-10-CM | POA: Diagnosis not present

## 2022-09-14 DIAGNOSIS — I10 Essential (primary) hypertension: Secondary | ICD-10-CM | POA: Diagnosis not present

## 2022-09-21 DIAGNOSIS — N1831 Chronic kidney disease, stage 3a: Secondary | ICD-10-CM | POA: Diagnosis not present

## 2022-09-21 DIAGNOSIS — I10 Essential (primary) hypertension: Secondary | ICD-10-CM | POA: Diagnosis not present

## 2022-09-21 DIAGNOSIS — E7849 Other hyperlipidemia: Secondary | ICD-10-CM | POA: Diagnosis not present

## 2022-09-21 DIAGNOSIS — Z6834 Body mass index (BMI) 34.0-34.9, adult: Secondary | ICD-10-CM | POA: Diagnosis not present

## 2022-09-21 DIAGNOSIS — K219 Gastro-esophageal reflux disease without esophagitis: Secondary | ICD-10-CM | POA: Diagnosis not present

## 2022-09-21 DIAGNOSIS — Z23 Encounter for immunization: Secondary | ICD-10-CM | POA: Diagnosis not present

## 2022-09-21 DIAGNOSIS — M1711 Unilateral primary osteoarthritis, right knee: Secondary | ICD-10-CM | POA: Diagnosis not present

## 2022-09-21 DIAGNOSIS — E039 Hypothyroidism, unspecified: Secondary | ICD-10-CM | POA: Diagnosis not present

## 2022-09-21 DIAGNOSIS — M1712 Unilateral primary osteoarthritis, left knee: Secondary | ICD-10-CM | POA: Diagnosis not present

## 2022-10-14 DIAGNOSIS — M810 Age-related osteoporosis without current pathological fracture: Secondary | ICD-10-CM | POA: Diagnosis not present

## 2022-10-14 DIAGNOSIS — Z780 Asymptomatic menopausal state: Secondary | ICD-10-CM | POA: Diagnosis not present

## 2023-02-14 DIAGNOSIS — R030 Elevated blood-pressure reading, without diagnosis of hypertension: Secondary | ICD-10-CM | POA: Diagnosis not present

## 2023-02-14 DIAGNOSIS — N3001 Acute cystitis with hematuria: Secondary | ICD-10-CM | POA: Diagnosis not present

## 2023-02-14 DIAGNOSIS — Z7689 Persons encountering health services in other specified circumstances: Secondary | ICD-10-CM | POA: Diagnosis not present

## 2023-03-01 DIAGNOSIS — Z6834 Body mass index (BMI) 34.0-34.9, adult: Secondary | ICD-10-CM | POA: Diagnosis not present

## 2023-03-01 DIAGNOSIS — R030 Elevated blood-pressure reading, without diagnosis of hypertension: Secondary | ICD-10-CM | POA: Diagnosis not present

## 2023-03-01 DIAGNOSIS — M542 Cervicalgia: Secondary | ICD-10-CM | POA: Diagnosis not present

## 2023-03-01 DIAGNOSIS — M62838 Other muscle spasm: Secondary | ICD-10-CM | POA: Diagnosis not present

## 2023-03-17 DIAGNOSIS — N1831 Chronic kidney disease, stage 3a: Secondary | ICD-10-CM | POA: Diagnosis not present

## 2023-03-17 DIAGNOSIS — G459 Transient cerebral ischemic attack, unspecified: Secondary | ICD-10-CM | POA: Diagnosis not present

## 2023-03-17 DIAGNOSIS — E559 Vitamin D deficiency, unspecified: Secondary | ICD-10-CM | POA: Diagnosis not present

## 2023-03-17 DIAGNOSIS — I10 Essential (primary) hypertension: Secondary | ICD-10-CM | POA: Diagnosis not present

## 2023-03-17 DIAGNOSIS — E7849 Other hyperlipidemia: Secondary | ICD-10-CM | POA: Diagnosis not present

## 2023-03-17 DIAGNOSIS — E039 Hypothyroidism, unspecified: Secondary | ICD-10-CM | POA: Diagnosis not present

## 2023-03-24 DIAGNOSIS — Z0001 Encounter for general adult medical examination with abnormal findings: Secondary | ICD-10-CM | POA: Diagnosis not present

## 2023-03-24 DIAGNOSIS — E7849 Other hyperlipidemia: Secondary | ICD-10-CM | POA: Diagnosis not present

## 2023-03-24 DIAGNOSIS — R42 Dizziness and giddiness: Secondary | ICD-10-CM | POA: Diagnosis not present

## 2023-03-24 DIAGNOSIS — M1712 Unilateral primary osteoarthritis, left knee: Secondary | ICD-10-CM | POA: Diagnosis not present

## 2023-03-24 DIAGNOSIS — K219 Gastro-esophageal reflux disease without esophagitis: Secondary | ICD-10-CM | POA: Diagnosis not present

## 2023-03-24 DIAGNOSIS — I10 Essential (primary) hypertension: Secondary | ICD-10-CM | POA: Diagnosis not present

## 2023-03-24 DIAGNOSIS — N1831 Chronic kidney disease, stage 3a: Secondary | ICD-10-CM | POA: Diagnosis not present

## 2023-03-24 DIAGNOSIS — M1711 Unilateral primary osteoarthritis, right knee: Secondary | ICD-10-CM | POA: Diagnosis not present

## 2023-03-24 DIAGNOSIS — Z6833 Body mass index (BMI) 33.0-33.9, adult: Secondary | ICD-10-CM | POA: Diagnosis not present

## 2023-03-24 DIAGNOSIS — E039 Hypothyroidism, unspecified: Secondary | ICD-10-CM | POA: Diagnosis not present

## 2023-03-31 DIAGNOSIS — R030 Elevated blood-pressure reading, without diagnosis of hypertension: Secondary | ICD-10-CM | POA: Diagnosis not present

## 2023-03-31 DIAGNOSIS — Z6834 Body mass index (BMI) 34.0-34.9, adult: Secondary | ICD-10-CM | POA: Diagnosis not present

## 2023-03-31 DIAGNOSIS — U071 COVID-19: Secondary | ICD-10-CM | POA: Diagnosis not present

## 2023-05-05 DIAGNOSIS — Z1231 Encounter for screening mammogram for malignant neoplasm of breast: Secondary | ICD-10-CM | POA: Diagnosis not present

## 2023-07-18 DIAGNOSIS — Z4689 Encounter for fitting and adjustment of other specified devices: Secondary | ICD-10-CM | POA: Diagnosis not present

## 2023-07-18 DIAGNOSIS — N8111 Cystocele, midline: Secondary | ICD-10-CM | POA: Diagnosis not present

## 2023-07-18 DIAGNOSIS — Z01419 Encounter for gynecological examination (general) (routine) without abnormal findings: Secondary | ICD-10-CM | POA: Diagnosis not present

## 2023-08-04 DIAGNOSIS — H40033 Anatomical narrow angle, bilateral: Secondary | ICD-10-CM | POA: Diagnosis not present

## 2023-08-04 DIAGNOSIS — H2513 Age-related nuclear cataract, bilateral: Secondary | ICD-10-CM | POA: Diagnosis not present

## 2023-09-12 DIAGNOSIS — N1831 Chronic kidney disease, stage 3a: Secondary | ICD-10-CM | POA: Diagnosis not present

## 2023-09-12 DIAGNOSIS — K219 Gastro-esophageal reflux disease without esophagitis: Secondary | ICD-10-CM | POA: Diagnosis not present

## 2023-09-12 DIAGNOSIS — E7849 Other hyperlipidemia: Secondary | ICD-10-CM | POA: Diagnosis not present

## 2023-09-12 DIAGNOSIS — E039 Hypothyroidism, unspecified: Secondary | ICD-10-CM | POA: Diagnosis not present

## 2023-09-12 DIAGNOSIS — I10 Essential (primary) hypertension: Secondary | ICD-10-CM | POA: Diagnosis not present

## 2023-09-19 DIAGNOSIS — E7849 Other hyperlipidemia: Secondary | ICD-10-CM | POA: Diagnosis not present

## 2023-09-19 DIAGNOSIS — M1712 Unilateral primary osteoarthritis, left knee: Secondary | ICD-10-CM | POA: Diagnosis not present

## 2023-09-19 DIAGNOSIS — K219 Gastro-esophageal reflux disease without esophagitis: Secondary | ICD-10-CM | POA: Diagnosis not present

## 2023-09-19 DIAGNOSIS — Z6835 Body mass index (BMI) 35.0-35.9, adult: Secondary | ICD-10-CM | POA: Diagnosis not present

## 2023-09-19 DIAGNOSIS — R42 Dizziness and giddiness: Secondary | ICD-10-CM | POA: Diagnosis not present

## 2023-09-19 DIAGNOSIS — N1831 Chronic kidney disease, stage 3a: Secondary | ICD-10-CM | POA: Diagnosis not present

## 2023-09-19 DIAGNOSIS — I10 Essential (primary) hypertension: Secondary | ICD-10-CM | POA: Diagnosis not present

## 2023-09-19 DIAGNOSIS — E039 Hypothyroidism, unspecified: Secondary | ICD-10-CM | POA: Diagnosis not present

## 2024-01-27 DIAGNOSIS — N8111 Cystocele, midline: Secondary | ICD-10-CM | POA: Diagnosis not present

## 2024-01-27 DIAGNOSIS — Z4689 Encounter for fitting and adjustment of other specified devices: Secondary | ICD-10-CM | POA: Diagnosis not present

## 2024-05-07 DIAGNOSIS — L818 Other specified disorders of pigmentation: Secondary | ICD-10-CM | POA: Diagnosis not present

## 2024-05-07 DIAGNOSIS — D2261 Melanocytic nevi of right upper limb, including shoulder: Secondary | ICD-10-CM | POA: Diagnosis not present

## 2024-05-07 DIAGNOSIS — B078 Other viral warts: Secondary | ICD-10-CM | POA: Diagnosis not present

## 2024-05-07 DIAGNOSIS — L308 Other specified dermatitis: Secondary | ICD-10-CM | POA: Diagnosis not present

## 2024-05-08 DIAGNOSIS — Z1231 Encounter for screening mammogram for malignant neoplasm of breast: Secondary | ICD-10-CM | POA: Diagnosis not present

## 2024-06-08 DIAGNOSIS — Z6835 Body mass index (BMI) 35.0-35.9, adult: Secondary | ICD-10-CM | POA: Diagnosis not present

## 2024-06-08 DIAGNOSIS — I10 Essential (primary) hypertension: Secondary | ICD-10-CM | POA: Diagnosis not present

## 2024-06-08 DIAGNOSIS — K219 Gastro-esophageal reflux disease without esophagitis: Secondary | ICD-10-CM | POA: Diagnosis not present

## 2024-06-08 DIAGNOSIS — Z23 Encounter for immunization: Secondary | ICD-10-CM | POA: Diagnosis not present

## 2024-06-08 DIAGNOSIS — D559 Anemia due to enzyme disorder, unspecified: Secondary | ICD-10-CM | POA: Diagnosis not present

## 2024-06-08 DIAGNOSIS — Z1321 Encounter for screening for nutritional disorder: Secondary | ICD-10-CM | POA: Diagnosis not present

## 2024-06-08 DIAGNOSIS — E782 Mixed hyperlipidemia: Secondary | ICD-10-CM | POA: Diagnosis not present

## 2024-06-08 DIAGNOSIS — E039 Hypothyroidism, unspecified: Secondary | ICD-10-CM | POA: Diagnosis not present

## 2024-06-08 DIAGNOSIS — Z0001 Encounter for general adult medical examination with abnormal findings: Secondary | ICD-10-CM | POA: Diagnosis not present

## 2024-06-08 DIAGNOSIS — E7849 Other hyperlipidemia: Secondary | ICD-10-CM | POA: Diagnosis not present

## 2024-07-31 DIAGNOSIS — N952 Postmenopausal atrophic vaginitis: Secondary | ICD-10-CM | POA: Diagnosis not present

## 2024-07-31 DIAGNOSIS — Z4689 Encounter for fitting and adjustment of other specified devices: Secondary | ICD-10-CM | POA: Diagnosis not present

## 2024-07-31 DIAGNOSIS — N8110 Cystocele, unspecified: Secondary | ICD-10-CM | POA: Diagnosis not present

## 2024-08-23 DIAGNOSIS — Z6835 Body mass index (BMI) 35.0-35.9, adult: Secondary | ICD-10-CM | POA: Diagnosis not present

## 2024-08-23 DIAGNOSIS — M1711 Unilateral primary osteoarthritis, right knee: Secondary | ICD-10-CM | POA: Diagnosis not present

## 2024-09-03 DIAGNOSIS — R300 Dysuria: Secondary | ICD-10-CM | POA: Diagnosis not present

## 2024-09-03 DIAGNOSIS — M1711 Unilateral primary osteoarthritis, right knee: Secondary | ICD-10-CM | POA: Diagnosis not present

## 2024-09-03 DIAGNOSIS — R350 Frequency of micturition: Secondary | ICD-10-CM | POA: Diagnosis not present

## 2024-09-03 DIAGNOSIS — Z6835 Body mass index (BMI) 35.0-35.9, adult: Secondary | ICD-10-CM | POA: Diagnosis not present

## 2024-09-03 DIAGNOSIS — M7121 Synovial cyst of popliteal space [Baker], right knee: Secondary | ICD-10-CM | POA: Diagnosis not present
